# Patient Record
Sex: Female | Born: 1955 | Race: White | Hispanic: No | Marital: Married | Smoking: Former smoker
Health system: Southern US, Community
[De-identification: ages and names within clinical notes are randomized; demographics above are authoritative.]

## PROBLEM LIST (undated history)

## (undated) DIAGNOSIS — I10 Essential (primary) hypertension: Secondary | ICD-10-CM

## (undated) DIAGNOSIS — E042 Nontoxic multinodular goiter: Secondary | ICD-10-CM

## (undated) DIAGNOSIS — Z972 Presence of dental prosthetic device (complete) (partial): Secondary | ICD-10-CM

## (undated) DIAGNOSIS — M1990 Unspecified osteoarthritis, unspecified site: Secondary | ICD-10-CM

## (undated) DIAGNOSIS — J449 Chronic obstructive pulmonary disease, unspecified: Secondary | ICD-10-CM

## (undated) DIAGNOSIS — C439 Malignant melanoma of skin, unspecified: Secondary | ICD-10-CM

## (undated) DIAGNOSIS — J45909 Unspecified asthma, uncomplicated: Secondary | ICD-10-CM

## (undated) DIAGNOSIS — K219 Gastro-esophageal reflux disease without esophagitis: Secondary | ICD-10-CM

## (undated) DIAGNOSIS — E7800 Pure hypercholesterolemia, unspecified: Secondary | ICD-10-CM

## (undated) DIAGNOSIS — R0600 Dyspnea, unspecified: Secondary | ICD-10-CM

## (undated) HISTORY — PX: TUBAL LIGATION: SHX77

## (undated) HISTORY — DX: Gastro-esophageal reflux disease without esophagitis: K21.9

## (undated) HISTORY — PX: BACK SURGERY: SHX140

## (undated) HISTORY — PX: TONSILLECTOMY: SUR1361

## (undated) HISTORY — PX: COLONOSCOPY: SHX174

## (undated) HISTORY — PX: CATARACT EXTRACTION: SUR2

## (undated) HISTORY — PX: CHOLECYSTECTOMY: SHX55

## (undated) HISTORY — DX: Pure hypercholesterolemia, unspecified: E78.00

## (undated) HISTORY — DX: Essential (primary) hypertension: I10

---

## 2003-04-24 ENCOUNTER — Other Ambulatory Visit: Payer: Self-pay

## 2003-06-22 ENCOUNTER — Other Ambulatory Visit: Payer: Self-pay

## 2004-07-27 ENCOUNTER — Ambulatory Visit: Payer: Self-pay | Admitting: Internal Medicine

## 2005-02-15 ENCOUNTER — Ambulatory Visit: Payer: Self-pay | Admitting: Internal Medicine

## 2005-08-16 ENCOUNTER — Emergency Department: Payer: Self-pay | Admitting: General Practice

## 2005-08-16 ENCOUNTER — Other Ambulatory Visit: Payer: Self-pay

## 2005-09-05 ENCOUNTER — Ambulatory Visit: Payer: Self-pay | Admitting: Internal Medicine

## 2005-12-01 ENCOUNTER — Ambulatory Visit: Payer: Self-pay | Admitting: Internal Medicine

## 2006-03-13 ENCOUNTER — Ambulatory Visit: Payer: Self-pay | Admitting: Internal Medicine

## 2006-08-10 ENCOUNTER — Ambulatory Visit: Payer: Self-pay | Admitting: Internal Medicine

## 2007-03-26 ENCOUNTER — Ambulatory Visit: Payer: Self-pay | Admitting: Internal Medicine

## 2008-01-09 ENCOUNTER — Ambulatory Visit: Payer: Self-pay | Admitting: Internal Medicine

## 2008-05-14 ENCOUNTER — Ambulatory Visit: Payer: Self-pay | Admitting: Internal Medicine

## 2009-09-06 ENCOUNTER — Ambulatory Visit: Payer: Self-pay | Admitting: Family Medicine

## 2010-09-22 ENCOUNTER — Ambulatory Visit: Payer: Self-pay | Admitting: Family Medicine

## 2011-04-18 DIAGNOSIS — R569 Unspecified convulsions: Secondary | ICD-10-CM

## 2011-04-18 HISTORY — DX: Unspecified convulsions: R56.9

## 2011-09-27 ENCOUNTER — Ambulatory Visit: Payer: Self-pay | Admitting: Internal Medicine

## 2011-10-11 ENCOUNTER — Ambulatory Visit: Payer: Self-pay | Admitting: Internal Medicine

## 2012-03-04 ENCOUNTER — Other Ambulatory Visit: Payer: Self-pay | Admitting: Internal Medicine

## 2012-03-04 NOTE — Telephone Encounter (Signed)
Refill Trazodone HCI 150 mg 1 tab at bedtime 90 day x 3 months , cyclobenzaprine  HCL tabs 10 mg 1 tab 3 times a day as needed. [facility] Mail order.

## 2012-03-06 MED ORDER — TRAZODONE HCL 150 MG PO TABS: 150.0000 mg | ORAL_TABLET | Freq: Every day | ORAL | Status: DC

## 2012-03-06 MED ORDER — CYCLOBENZAPRINE HCL 10 MG PO TABS: 10.0000 mg | ORAL_TABLET | Freq: Three times a day (TID) | ORAL | Status: DC | PRN

## 2012-03-06 NOTE — Telephone Encounter (Signed)
Sent in to pharmacy. Sent in to pharmacy.  

## 2012-05-08 ENCOUNTER — Telehealth: Payer: Self-pay | Admitting: Internal Medicine

## 2012-05-08 NOTE — Telephone Encounter (Signed)
Patient Information:  Caller Name: Nitasha  Phone: [phone]  Patient: Veronica Kennedy, Veronica Kennedy  Gender: Female  DOB: February 27, 1956  Age: 56 Years  PCP: Dale Alderton (Adults only)  Office Follow Up:  Does the office need to follow up with this patient?: No  Instructions For The Office: N/A  RN Note:  Productive cough with green phlem.  Hydrate and humidify.  Symptoms  Reason For Call & Symptoms: Feels poorly with productive cough, weakness, sore throat, and body aches.  Reviewed Health History In EMR: Yes  Reviewed Medications In EMR: Yes  Reviewed Allergies In EMR: Yes  Reviewed Surgeries / Procedures: Yes  Date of Onset of Symptoms: 05/05/2012  Treatments Tried: Alka-Seltzer Plus Cold  Treatments Tried Worked: Yes  Guideline(s) Used:  Cough  Disposition Per Guideline:   See Within 3 Days in Office  Reason For Disposition Reached:   Taking an ACE Inhibitor medication (e.g., benazepril/LOTENSIN, captopril/CAPOTEN, enalapril/VASOTEC, lisinopril/ZESTRIL)  Advice Given:  Reassurance  Coughing is the way that our lungs remove irritants and mucus. It helps protect our lungs from getting pneumonia.  Caution - Dextromethorphan:   Do not try to completely suppress coughs that produce mucus and phlegm. Remember that coughing is helpful in bringing up mucus from the lungs and preventing pneumonia.  Expected Course:   The expected course depends on what is causing the cough.  Viral bronchitis (chest cold) causes a cough that lasts 1 to 3 weeks. Sometimes you may cough up lots of phlegm (sputum, mucus). The mucus can normally be white, gray, yellow, or green.  Call Back If:  Difficulty breathing  Cough lasts more than 3 weeks  You become worse.  Appointment Scheduled:  05/09/2012 09:00:00 Appointment Scheduled Provider:  Orville Govern

## 2012-05-09 ENCOUNTER — Ambulatory Visit: Payer: Self-pay | Admitting: Adult Health

## 2012-05-13 ENCOUNTER — Ambulatory Visit: Payer: Self-pay

## 2012-05-13 LAB — RAPID STREP-A WITH REFLX: Micro Text Report: NEGATIVE

## 2012-05-15 LAB — BETA STREP CULTURE(ARMC)

## 2012-06-19 ENCOUNTER — Other Ambulatory Visit: Payer: Self-pay | Admitting: Internal Medicine

## 2012-06-19 NOTE — Telephone Encounter (Signed)
Received refill request electronically from pharmacy. Is it okay to refill? 

## 2012-06-20 ENCOUNTER — Telehealth: Payer: Self-pay | Admitting: *Deleted

## 2012-06-20 MED ORDER — TIOTROPIUM BROMIDE MONOHYDRATE 18 MCG IN CAPS: 18.0000 ug | ORAL_CAPSULE | Freq: Every day | RESPIRATORY_TRACT | Status: DC

## 2012-06-20 NOTE — Telephone Encounter (Signed)
Refilled trazodone.

## 2012-06-20 NOTE — Telephone Encounter (Signed)
Sent in to pharmacy.  

## 2012-06-20 NOTE — Telephone Encounter (Signed)
Refill Request  Spiriva Handihaler  Caps 90's   Inhale the contents of 1 capsule once daily

## 2012-08-28 ENCOUNTER — Other Ambulatory Visit: Payer: Self-pay | Admitting: Internal Medicine

## 2012-08-29 ENCOUNTER — Telehealth: Payer: Self-pay | Admitting: *Deleted

## 2012-08-29 MED ORDER — CITALOPRAM HYDROBROMIDE 40 MG PO TABS: 60.0000 mg | ORAL_TABLET | Freq: Every day | ORAL | Status: DC

## 2012-08-29 NOTE — Telephone Encounter (Signed)
Okay to refill? No record of requested medication

## 2012-08-29 NOTE — Telephone Encounter (Signed)
This was a pt of mine at [facility].  i have not seen her here.  She has an appt with me in 6/14.  i know she was on citalopram but I do not remember the dose.  If this is what she has been on - then ok to refill x one month to get her to her appt.   Thanks.

## 2012-08-29 NOTE — Telephone Encounter (Signed)
Refill Request  Citalopram hbr tab 40 mg  Take one and one-half tablets once a day

## 2012-09-02 ENCOUNTER — Telehealth: Payer: Self-pay | Admitting: Internal Medicine

## 2012-09-02 NOTE — Telephone Encounter (Signed)
Patient wanting labs done prior to her physical on 6.5.14. Needing orders in the system.

## 2012-09-03 NOTE — Telephone Encounter (Signed)
Pt informed

## 2012-09-03 NOTE — Telephone Encounter (Signed)
I have not seen her yet here.  Will need to be seen before ordering labs (I don't know that insurance would cover - since not seen here).

## 2012-09-15 ENCOUNTER — Ambulatory Visit: Payer: Self-pay | Admitting: Family Medicine

## 2012-09-15 LAB — CBC WITH DIFFERENTIAL/PLATELET
Basophil #: 0 10*3/uL (ref 0.0–0.1)
Basophil %: 0.3 %
Eosinophil #: 0.2 10*3/uL (ref 0.0–0.7)
HCT: 34.5 % — ABNORMAL LOW (ref 35.0–47.0)
HGB: 12 g/dL (ref 12.0–16.0)
Lymphocyte #: 1 10*3/uL (ref 1.0–3.6)
Lymphocyte %: 7.3 %
MCH: 31.1 pg (ref 26.0–34.0)
MCV: 89 fL (ref 80–100)
Platelet: 393 10*3/uL (ref 150–440)
WBC: 13.7 10*3/uL — ABNORMAL HIGH (ref 3.6–11.0)

## 2012-09-15 LAB — COMPREHENSIVE METABOLIC PANEL
Albumin: 2.6 g/dL — ABNORMAL LOW (ref 3.4–5.0)
Anion Gap: 7 (ref 7–16)
BUN: 14 mg/dL (ref 7–18)
Bilirubin,Total: 0.6 mg/dL (ref 0.2–1.0)
Calcium, Total: 9.4 mg/dL (ref 8.5–10.1)
Chloride: 96 mmol/L — ABNORMAL LOW (ref 98–107)
Co2: 32 mmol/L (ref 21–32)
Creatinine: 0.69 mg/dL (ref 0.60–1.30)
EGFR (African American): >60
Glucose: 85 mg/dL (ref 65–99)
SGOT(AST): 22 U/L (ref 15–37)
SGPT (ALT): 35 U/L (ref 12–78)
Sodium: 135 mmol/L — ABNORMAL LOW (ref 136–145)
Total Protein: 7.6 g/dL (ref 6.4–8.2)

## 2012-09-15 LAB — RAPID INFLUENZA A&B ANTIGENS

## 2012-09-17 ENCOUNTER — Other Ambulatory Visit: Payer: Self-pay

## 2012-09-19 ENCOUNTER — Ambulatory Visit (INDEPENDENT_AMBULATORY_CARE_PROVIDER_SITE_OTHER): Payer: BC Managed Care – PPO | Admitting: Internal Medicine

## 2012-09-19 ENCOUNTER — Encounter: Payer: Self-pay | Admitting: *Deleted

## 2012-09-19 VITALS — BP 106/70 | HR 83 | Temp 98.3°F | Ht 64.0 in | Wt 172.5 lb

## 2012-09-19 DIAGNOSIS — I10 Essential (primary) hypertension: Secondary | ICD-10-CM

## 2012-09-19 DIAGNOSIS — R5381 Other malaise: Secondary | ICD-10-CM

## 2012-09-19 DIAGNOSIS — K219 Gastro-esophageal reflux disease without esophagitis: Secondary | ICD-10-CM

## 2012-09-19 DIAGNOSIS — Z733 Stress, not elsewhere classified: Secondary | ICD-10-CM

## 2012-09-19 DIAGNOSIS — E7800 Pure hypercholesterolemia, unspecified: Secondary | ICD-10-CM

## 2012-09-19 DIAGNOSIS — R5383 Other fatigue: Secondary | ICD-10-CM

## 2012-09-19 DIAGNOSIS — Z1239 Encounter for other screening for malignant neoplasm of breast: Secondary | ICD-10-CM

## 2012-09-19 DIAGNOSIS — F439 Reaction to severe stress, unspecified: Secondary | ICD-10-CM

## 2012-09-20 ENCOUNTER — Other Ambulatory Visit (INDEPENDENT_AMBULATORY_CARE_PROVIDER_SITE_OTHER): Payer: BC Managed Care – PPO

## 2012-09-20 DIAGNOSIS — E7800 Pure hypercholesterolemia, unspecified: Secondary | ICD-10-CM

## 2012-09-20 DIAGNOSIS — R5383 Other fatigue: Secondary | ICD-10-CM

## 2012-09-20 DIAGNOSIS — R5381 Other malaise: Secondary | ICD-10-CM

## 2012-09-20 DIAGNOSIS — I10 Essential (primary) hypertension: Secondary | ICD-10-CM

## 2012-09-20 LAB — CBC WITH DIFFERENTIAL/PLATELET
Eosinophils Relative: 1.8 % (ref 0.0–5.0)
HCT: 35.9 % — ABNORMAL LOW (ref 36.0–46.0)
Hemoglobin: 12.2 g/dL (ref 12.0–15.0)
Lymphs Abs: 1.3 10*3/uL (ref 0.7–4.0)
MCV: 91.7 fl (ref 78.0–100.0)
Monocytes Absolute: 0.5 10*3/uL (ref 0.1–1.0)
Monocytes Relative: 7.6 % (ref 3.0–12.0)
Neutro Abs: 4.1 10*3/uL (ref 1.4–7.7)
Platelets: 525 10*3/uL — ABNORMAL HIGH (ref 150.0–400.0)
WBC: 6 10*3/uL (ref 4.5–10.5)

## 2012-09-20 LAB — TSH: TSH: 0.29 u[IU]/mL — ABNORMAL LOW (ref 0.35–5.50)

## 2012-09-20 LAB — HEPATIC FUNCTION PANEL
ALT: 24 U/L (ref 0–35)
AST: 17 U/L (ref 0–37)
Albumin: 2.8 g/dL — ABNORMAL LOW (ref 3.5–5.2)
Total Bilirubin: 0.3 mg/dL (ref 0.3–1.2)
Total Protein: 6.3 g/dL (ref 6.0–8.3)

## 2012-09-20 LAB — BASIC METABOLIC PANEL
BUN: 23 mg/dL (ref 6–23)
Chloride: 103 mEq/L (ref 96–112)
Glucose, Bld: 89 mg/dL (ref 70–99)
Potassium: 4.7 mEq/L (ref 3.5–5.1)
Sodium: 141 mEq/L (ref 135–145)

## 2012-09-20 LAB — LIPID PANEL
Cholesterol: 124 mg/dL (ref 0–200)
HDL: 31.7 mg/dL — ABNORMAL LOW (ref >39.00)
Triglycerides: 56 mg/dL (ref 0.0–149.0)

## 2012-09-21 ENCOUNTER — Other Ambulatory Visit: Payer: Self-pay | Admitting: Internal Medicine

## 2012-09-21 ENCOUNTER — Encounter: Payer: Self-pay | Admitting: Internal Medicine

## 2012-09-21 DIAGNOSIS — F439 Reaction to severe stress, unspecified: Secondary | ICD-10-CM | POA: Insufficient documentation

## 2012-09-21 DIAGNOSIS — K219 Gastro-esophageal reflux disease without esophagitis: Secondary | ICD-10-CM | POA: Insufficient documentation

## 2012-09-21 MED ORDER — CITALOPRAM HYDROBROMIDE 40 MG PO TABS: 60.0000 mg | ORAL_TABLET | Freq: Every day | ORAL | Status: AC

## 2012-09-21 NOTE — Progress Notes (Signed)
Refilled citalopram #135 with one refill.

## 2012-09-21 NOTE — Progress Notes (Signed)
Subjective:    Patient ID: Veronica Kennedy, female    DOB: 11-Feb-1956, 57 y.o.   MRN: [id number]  HPI 57 year old female with past history of hypertension, hypercholesterolemia and GERD who comes in today to follow up on these issues as well as for a complete physical exam.  She was seen recently in [facility].  Was diagnosed with RLL pneumonia.  Was dehydrated.  Given IVFs and started on Levaquin.  She continues on abx now.  Symptoms are improving.  Using albuterol prn.  Also using her spiriva.  No increased sob.  No chest pain.  No acid reflux.  Takes nexium.  Some nausea.  No vomiting.  Bowels stable.   Past Medical History  Diagnosis Date  . Hypertension   . Hypercholesterolemia   . GERD (gastroesophageal reflux disease)     Outpatient Encounter Prescriptions as of 09/19/2012  Medication Sig Dispense Refill  . albuterol (PROVENTIL HFA;VENTOLIN HFA) 108 (90 BASE) MCG/ACT inhaler Inhale 2 puffs into the lungs every 6 (six) hours as needed for wheezing.      Marland Kitchen atorvastatin (LIPITOR) 10 MG tablet Take 10 mg by mouth daily.      . citalopram (CELEXA) 40 MG tablet Take 1.5 tablets (60 mg total) by mouth daily.  45 tablet  0  . cyclobenzaprine (FLEXERIL) 10 MG tablet Take 1 tablet (10 mg total) by mouth 3 (three) times daily as needed.  90 tablet  3  . esomeprazole (NEXIUM) 40 MG capsule Take 40 mg by mouth daily.      . hydrochlorothiazide (HYDRODIURIL) 25 MG tablet Take 25 mg by mouth daily.      . ramipril (ALTACE) 2.5 MG capsule Take 2.5 mg by mouth daily.      Marland Kitchen SPIRIVA HANDIHALER 18 MCG inhalation capsule INHALE THE CONTENTS OF 1 CAPSULE DAILY  90 capsule  0  . traZODone (DESYREL) 150 MG tablet TAKE 1 TABLET (150 MG TOTAL) AT BEDTIME  30 tablet  2   No facility-administered encounter medications on file as of 09/19/2012.    Review of Systems Patient denies any headache, lightheadedness or dizziness.  No significant sinus symptoms currently.  No chest pain, tightness or  palpitations.  No increased shortness of breath.  Being treated for pneumonia.  Some nausea.  No vomiting.  No abdominal pain or cramping.  No bowel change, such as diarrhea, constipation, BRBPR or melana.  No urine change.   On citalopram.  Feels she is handling things relatively well. Taking Levaquin now.  Symptoms improving.  She does report some increased fatigue.      Objective:   Physical Exam Filed Vitals:   09/19/12 1556  BP: 106/70  Pulse: 83  Temp: 98.3 F (27.4 C)   57 year old female in no acute distress.   HEENT:  Nares- clear.  Oropharynx - without lesions. NECK:  Supple.  Nontender.  No audible bruit.  HEART:  Appears to be regular. LUNGS:  No crackles or wheezing audible.  Respirations even and unlabored.  RADIAL PULSE:  Equal bilaterally.    BREASTS:  No nipple discharge or nipple retraction present.  Could not appreciate any distinct nodules or axillary adenopathy.  ABDOMEN:  Soft, nontender.  Bowel sounds present and normal.  No audible abdominal bruit.  EXTREMITIES:  No increased edema present.  DP pulses palpable and equal bilaterally.          Assessment & Plan:  PNEUMONIA.  Evaluated at [facility].  On Levaquin.  Using the inhalers.  Has improved.  Discussed with her regarding the need for a follow up CXR in a few weeks.    FATIGUE.  Check cbc, met c and tsh.   HEALTH MAINTENANCE.  Physical today.  Schedule mammogram.  Need to obtain outside records for review.

## 2012-09-21 NOTE — Assessment & Plan Note (Signed)
Symptoms controlled.  Follow.  Continue nexium.

## 2012-09-21 NOTE — Assessment & Plan Note (Signed)
Blood pressure doing well.  Continue same medication regimen.  Follow.  Check metabolic panel.

## 2012-09-21 NOTE — Assessment & Plan Note (Signed)
Continues on Atorvastatin.  Check lipid panel and liver function.

## 2012-09-21 NOTE — Assessment & Plan Note (Signed)
On citalopram.  Refill.  Follow.  Appears to be stable.

## 2012-09-23 ENCOUNTER — Ambulatory Visit: Payer: BC Managed Care – PPO

## 2012-09-23 ENCOUNTER — Other Ambulatory Visit: Payer: Self-pay | Admitting: Internal Medicine

## 2012-09-23 DIAGNOSIS — R7989 Other specified abnormal findings of blood chemistry: Secondary | ICD-10-CM

## 2012-09-23 DIAGNOSIS — D473 Essential (hemorrhagic) thrombocythemia: Secondary | ICD-10-CM

## 2012-09-23 DIAGNOSIS — E039 Hypothyroidism, unspecified: Secondary | ICD-10-CM

## 2012-09-23 NOTE — Progress Notes (Signed)
Follow up labs ordered.

## 2012-09-30 ENCOUNTER — Other Ambulatory Visit: Payer: Self-pay | Admitting: *Deleted

## 2012-09-30 MED ORDER — HYDROCHLOROTHIAZIDE 25 MG PO TABS: 25.0000 mg | ORAL_TABLET | Freq: Every day | ORAL | Status: DC

## 2012-09-30 MED ORDER — TRAZODONE HCL 150 MG PO TABS: ORAL_TABLET | ORAL | Status: DC

## 2012-10-15 ENCOUNTER — Ambulatory Visit: Payer: Self-pay | Admitting: Internal Medicine

## 2012-10-22 ENCOUNTER — Telehealth: Payer: Self-pay | Admitting: *Deleted

## 2012-10-22 ENCOUNTER — Other Ambulatory Visit (INDEPENDENT_AMBULATORY_CARE_PROVIDER_SITE_OTHER): Payer: BC Managed Care – PPO

## 2012-10-22 DIAGNOSIS — D75839 Thrombocytosis, unspecified: Secondary | ICD-10-CM

## 2012-10-22 DIAGNOSIS — D473 Essential (hemorrhagic) thrombocythemia: Secondary | ICD-10-CM

## 2012-10-22 DIAGNOSIS — R946 Abnormal results of thyroid function studies: Secondary | ICD-10-CM

## 2012-10-22 DIAGNOSIS — R7989 Other specified abnormal findings of blood chemistry: Secondary | ICD-10-CM

## 2012-10-22 NOTE — Telephone Encounter (Signed)
Pt was in having labs drawn & wanted to ask if she still needs a f/u Chest x-ray for recent Pneumonia. Pt states that she is no longer symptomatic. If she needs to have it done, she would like it ordered through [facility] [place] location

## 2012-10-23 LAB — CBC WITH DIFFERENTIAL/PLATELET
Basophils Relative: 0.6 % (ref 0.0–3.0)
Eosinophils Relative: 1.8 % (ref 0.0–5.0)
HCT: 35 % — ABNORMAL LOW (ref 36.0–46.0)
Lymphs Abs: 1.9 10*3/uL (ref 0.7–4.0)
MCV: 94.4 fl (ref 78.0–100.0)
Monocytes Absolute: 0.4 10*3/uL (ref 0.1–1.0)
Monocytes Relative: 7.3 % (ref 3.0–12.0)
Neutrophils Relative %: 54.9 % (ref 43.0–77.0)
Platelets: 270 10*3/uL (ref 150.0–400.0)
RBC: 3.71 Mil/uL — ABNORMAL LOW (ref 3.87–5.11)
WBC: 5.3 10*3/uL (ref 4.5–10.5)

## 2012-10-23 LAB — TSH: TSH: 0.47 u[IU]/mL (ref 0.35–5.50)

## 2012-10-23 NOTE — Telephone Encounter (Signed)
Xray form completed,signed and in your box.

## 2012-10-23 NOTE — Telephone Encounter (Signed)
Left message on pt v/m to let her know that the order was faxed to Radiology-No appt necessary.

## 2012-10-24 ENCOUNTER — Ambulatory Visit: Payer: Self-pay | Admitting: Internal Medicine

## 2012-10-24 ENCOUNTER — Telehealth: Payer: Self-pay | Admitting: Internal Medicine

## 2012-10-24 ENCOUNTER — Ambulatory Visit: Payer: BC Managed Care – PPO

## 2012-10-24 DIAGNOSIS — D649 Anemia, unspecified: Secondary | ICD-10-CM

## 2012-10-24 LAB — FERRITIN: Ferritin: 54.4 ng/mL (ref 10.0–291.0)

## 2012-10-24 LAB — IBC PANEL
Iron: 83 ug/dL (ref 42–145)
Saturation Ratios: 23 % (ref 20.0–50.0)

## 2012-10-24 NOTE — Telephone Encounter (Signed)
Order placed for add on lab (iron studies)

## 2012-10-25 ENCOUNTER — Encounter: Payer: Self-pay | Admitting: Internal Medicine

## 2012-10-25 ENCOUNTER — Telehealth: Payer: Self-pay | Admitting: Internal Medicine

## 2012-10-25 ENCOUNTER — Encounter: Payer: Self-pay | Admitting: *Deleted

## 2012-10-25 DIAGNOSIS — D649 Anemia, unspecified: Secondary | ICD-10-CM

## 2012-10-25 NOTE — Telephone Encounter (Signed)
Pt notified of lab results via my chart.  She needs a f/u non fasting lab in 4-6 weeks.  Please schedule her for a lab appt and call her with appt date and time.  Thanks.

## 2012-10-28 ENCOUNTER — Encounter: Payer: Self-pay | Admitting: *Deleted

## 2012-10-28 ENCOUNTER — Ambulatory Visit: Payer: Self-pay | Admitting: Internal Medicine

## 2012-10-28 NOTE — Telephone Encounter (Signed)
Sent my chart message letting pt know her appointment is 8/18

## 2012-11-01 ENCOUNTER — Encounter: Payer: Self-pay | Admitting: Internal Medicine

## 2012-11-07 ENCOUNTER — Encounter: Payer: Self-pay | Admitting: Internal Medicine

## 2012-11-08 ENCOUNTER — Other Ambulatory Visit: Payer: Self-pay | Admitting: *Deleted

## 2012-11-08 MED ORDER — ATORVASTATIN CALCIUM 10 MG PO TABS: 10.0000 mg | ORAL_TABLET | Freq: Every day | ORAL | Status: AC

## 2012-11-08 MED ORDER — RAMIPRIL 2.5 MG PO CAPS: 2.5000 mg | ORAL_CAPSULE | Freq: Every day | ORAL | Status: DC

## 2012-11-21 ENCOUNTER — Encounter: Payer: Self-pay | Admitting: Internal Medicine

## 2012-12-02 ENCOUNTER — Other Ambulatory Visit: Payer: Self-pay | Admitting: *Deleted

## 2012-12-02 ENCOUNTER — Other Ambulatory Visit: Payer: BC Managed Care – PPO

## 2012-12-03 MED ORDER — ESOMEPRAZOLE MAGNESIUM 40 MG PO CPDR: 40.0000 mg | DELAYED_RELEASE_CAPSULE | Freq: Every day | ORAL | Status: DC

## 2012-12-21 ENCOUNTER — Other Ambulatory Visit: Payer: Self-pay | Admitting: Internal Medicine

## 2013-01-02 ENCOUNTER — Ambulatory Visit: Payer: Self-pay

## 2013-01-07 ENCOUNTER — Other Ambulatory Visit: Payer: Self-pay | Admitting: *Deleted

## 2013-01-08 ENCOUNTER — Encounter: Payer: Self-pay | Admitting: *Deleted

## 2013-01-08 MED ORDER — TRAZODONE HCL 150 MG PO TABS: ORAL_TABLET | ORAL | Status: AC

## 2013-01-08 NOTE — Telephone Encounter (Signed)
Sent pt a mychart to inform her that she needs to pick up RX & schedule a follow-up appt.

## 2013-01-08 NOTE — Telephone Encounter (Signed)
I refilled her medication x 1.  She needs a f/u appt with me within the next month ( ) appt.

## 2013-01-13 ENCOUNTER — Encounter: Payer: Self-pay | Admitting: *Deleted

## 2013-01-21 ENCOUNTER — Ambulatory Visit: Payer: BC Managed Care – PPO | Admitting: Internal Medicine

## 2013-01-29 ENCOUNTER — Encounter: Payer: Self-pay | Admitting: Internal Medicine

## 2013-02-20 ENCOUNTER — Other Ambulatory Visit: Payer: Self-pay

## 2013-02-21 ENCOUNTER — Ambulatory Visit: Payer: BC Managed Care – PPO | Admitting: Internal Medicine

## 2013-06-30 ENCOUNTER — Other Ambulatory Visit: Payer: Self-pay | Admitting: Internal Medicine

## 2013-06-30 NOTE — Telephone Encounter (Signed)
Last visit 6/14, has cancelled follow up appts that were scheduled for refills, refill Nexium?

## 2013-06-30 NOTE — Telephone Encounter (Signed)
I have not seen her since 09/2012.  Is she still coming here or is she seeing someone else?  She has missed appts.  If still coming here, then needs a f/u appt with me and can refill until appt.  appt needs to be 32min or end of 1/2 day.   Dr Nicki Reaper

## 2013-09-05 ENCOUNTER — Ambulatory Visit: Payer: Self-pay | Admitting: Family Medicine

## 2013-09-19 ENCOUNTER — Ambulatory Visit: Payer: Self-pay | Admitting: Gastroenterology

## 2013-09-22 ENCOUNTER — Encounter: Payer: BC Managed Care – PPO | Admitting: Internal Medicine

## 2013-09-22 LAB — PATHOLOGY REPORT

## 2013-11-10 ENCOUNTER — Ambulatory Visit: Payer: Self-pay | Admitting: Family Medicine

## 2014-03-20 ENCOUNTER — Ambulatory Visit: Payer: Self-pay | Admitting: Internal Medicine

## 2014-11-26 ENCOUNTER — Other Ambulatory Visit: Payer: Self-pay | Admitting: Family Medicine

## 2014-11-26 DIAGNOSIS — Z1231 Encounter for screening mammogram for malignant neoplasm of breast: Secondary | ICD-10-CM

## 2014-12-01 ENCOUNTER — Ambulatory Visit
Admission: RE | Admit: 2014-12-01 | Discharge: 2014-12-01 | Disposition: A | Discharge status: Home / Self Care | Payer: BLUE CROSS/BLUE SHIELD | Source: Ambulatory Visit | Attending: Family Medicine | Admitting: Family Medicine

## 2014-12-01 DIAGNOSIS — Z1231 Encounter for screening mammogram for malignant neoplasm of breast: Secondary | ICD-10-CM | POA: Insufficient documentation

## 2015-05-04 ENCOUNTER — Ambulatory Visit (INDEPENDENT_AMBULATORY_CARE_PROVIDER_SITE_OTHER): Payer: BLUE CROSS/BLUE SHIELD

## 2015-05-04 ENCOUNTER — Encounter: Payer: Self-pay | Admitting: Emergency Medicine

## 2015-05-04 ENCOUNTER — Ambulatory Visit
Admission: EM | Admit: 2015-05-04 | Discharge: 2015-05-04 | Disposition: A | Discharge status: Home / Self Care | Payer: BLUE CROSS/BLUE SHIELD | Attending: Emergency Medicine | Admitting: Emergency Medicine

## 2015-05-04 DIAGNOSIS — J441 Chronic obstructive pulmonary disease with (acute) exacerbation: Secondary | ICD-10-CM

## 2015-05-04 HISTORY — DX: Chronic obstructive pulmonary disease, unspecified: J44.9

## 2015-05-04 LAB — BASIC METABOLIC PANEL
ANION GAP: 7 (ref 5–15)
BUN: 12 mg/dL (ref 6–20)
CALCIUM: 9.6 mg/dL (ref 8.9–10.3)
CO2: 32 mmol/L (ref 22–32)
CREATININE: 0.73 mg/dL (ref 0.44–1.00)
Chloride: 100 mmol/L — ABNORMAL LOW (ref 101–111)
GFR calc Af Amer: >60 mL/min (ref >60)
GLUCOSE: 91 mg/dL (ref 65–99)
Potassium: 4.4 mmol/L (ref 3.5–5.1)
Sodium: 139 mmol/L (ref 135–145)

## 2015-05-04 LAB — CBC WITH DIFFERENTIAL/PLATELET
BASOS ABS: 0 10*3/uL (ref 0–0.1)
BASOS PCT: 1 %
EOS PCT: 3 %
Eosinophils Absolute: 0.1 10*3/uL (ref 0–0.7)
HEMATOCRIT: 39.5 % (ref 35.0–47.0)
Hemoglobin: 13.4 g/dL (ref 12.0–16.0)
LYMPHS PCT: 29 %
Lymphs Abs: 1.4 10*3/uL (ref 1.0–3.6)
MCH: 31.1 pg (ref 26.0–34.0)
MCHC: 33.9 g/dL (ref 32.0–36.0)
MCV: 91.6 fL (ref 80.0–100.0)
MONO ABS: 0.4 10*3/uL (ref 0.2–0.9)
MONOS PCT: 8 %
NEUTROS ABS: 2.8 10*3/uL (ref 1.4–6.5)
Neutrophils Relative %: 59 %
PLATELETS: 258 10*3/uL (ref 150–440)
RBC: 4.31 MIL/uL (ref 3.80–5.20)
RDW: 12.8 % (ref 11.5–14.5)
WBC: 4.7 10*3/uL (ref 3.6–11.0)

## 2015-05-04 LAB — URINALYSIS COMPLETE WITH MICROSCOPIC (ARMC ONLY)
BACTERIA UA: NONE SEEN
BILIRUBIN URINE: NEGATIVE
GLUCOSE, UA: NEGATIVE mg/dL
Hgb urine dipstick: NEGATIVE
KETONES UR: NEGATIVE mg/dL
Leukocytes, UA: NEGATIVE
Nitrite: NEGATIVE
Protein, ur: NEGATIVE mg/dL
Specific Gravity, Urine: 1.015 (ref 1.005–1.030)
WBC, UA: NONE SEEN WBC/hpf (ref 0–5)
pH: 7.5 (ref 5.0–8.0)

## 2015-05-04 MED ORDER — PREDNISONE 10 MG PO TABS: ORAL_TABLET | ORAL | Status: DC

## 2015-05-04 MED ORDER — IPRATROPIUM-ALBUTEROL 0.5-2.5 (3) MG/3ML IN SOLN
3.0000 mL | Freq: Four times a day (QID) | RESPIRATORY_TRACT | Status: DC
  Administered 2015-05-04: 3 mL via RESPIRATORY_TRACT

## 2015-05-04 NOTE — ED Notes (Signed)
Patient c/o SOB and wheezing for the past week.  Patient states that she has COPD.  Patient denies any cold symptoms.

## 2015-05-04 NOTE — ED Notes (Signed)
Patient transported to X-ray 

## 2015-05-04 NOTE — ED Notes (Signed)
Nebulizer Tx complete.. Pt states she is feeling better.Marland Kitchen

## 2015-05-04 NOTE — Discharge Instructions (Signed)
Take medication as prescribed. Rest. Drink plenty of fluids. Avoid triggers. Continue home inhalers.  Follow-up closely with her primary care physician or pulmonologist this week. Return to urgent care proceed to ER for chest pain, shortness of breath, dizziness, weakness, fevers, new or worsening concerns.  Chronic Obstructive Pulmonary Disease Exacerbation Chronic obstructive pulmonary disease (COPD) is a common lung problem. In COPD, the flow of air from the lungs is limited. COPD exacerbations are times that breathing gets worse and you need extra treatment. Without treatment they can be life threatening. If they happen often, your lungs can become more damaged. If your COPD gets worse, your doctor may treat you with:  Medicines.  Oxygen.  Different ways to clear your airway, such as using a mask. HOME CARE  Do not smoke.  Avoid tobacco smoke and other things that bother your lungs.  If given, take your antibiotic medicine as told. Finish the medicine even if you start to feel better.  Only take medicines as told by your doctor.  Drink enough fluids to keep your pee (urine) clear or pale yellow (unless your doctor has told you not to).  Use a cool mist machine (vaporizer).  If you use oxygen or a machine that turns liquid medicine into a mist (nebulizer), continue to use them as told.  Keep up with shots (vaccinations) as told by your doctor.  Exercise regularly.  Eat healthy foods.  Keep all doctor visits as told. GET HELP RIGHT AWAY IF:  You are very short of breath and it gets worse.  You have trouble talking.  You have bad chest pain.  You have blood in your spit (sputum).  You have a fever.  You keep throwing up (vomiting).  You feel weak, or you pass out (faint).  You feel confused.  You keep getting worse. MAKE SURE YOU:  Understand these instructions.  Will watch your condition.  Will get help right away if you are not doing well or get worse.   This information is not intended to replace advice given to you by your health care provider. Make sure you discuss any questions you have with your health care provider.   Document Released: 03/23/2011 Document Revised: 04/24/2014 Document Reviewed: 12/06/2012 Elsevier Interactive Patient Education [employer].

## 2015-05-04 NOTE — ED Provider Notes (Signed)
[place] [facility]  ____________________________________________  Time seen: Approximately 9:04 AM  I have reviewed the triage vital signs and the nursing notes.   HISTORY  Chief Complaint Shortness of Breath   HPI Veronica Kennedy is a 60 y.o. female   presents today for the complaint of wheezing and shortness of breath. Patient reports that she has a chronic history of COPD with intermittent exacerbations. Patient states over last week she has had increased wheezing. Patient states that she believes there is some correlation between a different air barrier that blows air  at work as well as with weather changes. Patient states that she does occasionally have some shortness of breath, but reports that this is only noticed when her wheezing has increased or she is doing more strenuous activity. Denies any shortness of breath while at rest or in casual activities. Denies any chest pain or chest pain with deep breaths.  Patient reports that she has had a little bit of runny nose and occasional cough. Denies nasal congestion, fevers, sore throat, productive cough. Denies current shortness of breath. Denies chest pain, abdominal pain, dizziness, weakness, neck or back pain, extremity swelling or rash. Reports continues to eat and drink well. Reports has been using home albuterol inhalers with some improvement but no resolution. Denies any recent COPD exacerbation.  PCP: [facility]: [facility]   Past Medical History  Diagnosis Date  . Hypertension   . Hypercholesterolemia   . GERD (gastroesophageal reflux disease)   . COPD (chronic obstructive pulmonary disease) [facility])     Patient Active Problem List   Diagnosis Date Noted  . GERD (gastroesophageal reflux disease) 09/21/2012  . Stress 09/21/2012  . Essential hypertension, benign 09/19/2012  . Hypercholesterolemia 09/19/2012    Past Surgical History  Procedure Laterality Date  . Tubal ligation    . Tonsillectomy    .  Cholecystectomy      Current Outpatient Rx  Name  Route  Sig  Dispense  Refill  . albuterol (PROVENTIL HFA;VENTOLIN HFA) 108 (90 BASE) MCG/ACT inhaler   Inhalation   Inhale 2 puffs into the lungs every 6 (six) hours as needed for wheezing.         Marland Kitchen atorvastatin (LIPITOR) 10 MG tablet   Oral   Take 1 tablet (10 mg total) by mouth daily.   90 tablet   1   . citalopram (CELEXA) 40 MG tablet   Oral   Take 1.5 tablets (60 mg total) by mouth daily.   135 tablet   1       . cyclobenzaprine (FLEXERIL) 10 MG tablet   Oral   Take 1 tablet (10 mg total) by mouth 3 (three) times daily as needed.   90 tablet   3   . esomeprazole (NEXIUM) 40 MG capsule   Oral   Take 1 capsule (40 mg total) by mouth daily.   90 capsule   1   . hydrochlorothiazide (HYDRODIURIL) 25 MG tablet   Oral   Take 1 tablet (25 mg total) by mouth daily.   90 tablet   1   . ramipril (ALTACE) 2.5 MG capsule   Oral   Take 1 capsule (2.5 mg total) by mouth daily.   90 capsule   1   . SPIRIVA HANDIHALER 18 MCG inhalation capsule      INHALE THE CONTENTS OF 1 CAPSULE DAILY   1 capsule   5   . traZODone (DESYREL) 150 MG tablet  TAKE 1 TABLET (150 MG TOTAL) AT BEDTIME   90 tablet   0     Allergies Review of patient's allergies indicates no known allergies.  Family History  Problem Relation Age of Onset  . Breast cancer Maternal Aunt 80  Brother: MI Mom: htn  Social History Social History  Substance Use Topics  . Smoking status: Former Research scientist (life sciences)  . Smokeless tobacco: Never Used  . Alcohol Use: No    Review of Systems Constitutional: No fever/chills Eyes: No visual changes. ENT: No sore throat. Cardiovascular: Denies chest pain. Respiratory: positive intermittent cough, wheezing and shortness of breath.  Gastrointestinal: No abdominal pain.  No nausea, no vomiting.  No diarrhea.  No constipation. Genitourinary: Negative for dysuria. Musculoskeletal: Negative for back pain. Skin:  Negative for rash. Neurological: Negative for headaches, focal weakness or numbness.  10-point ROS otherwise negative.  ____________________________________________   PHYSICAL EXAM:  VITAL SIGNS: ED Triage Vitals  Enc Vitals Group     BP 05/04/15 0816 140/91 mmHg     Pulse Rate 05/04/15 0816 70     Resp 05/04/15 0816 16     Temp 05/04/15 0816 97 F (36.1 C)     Temp Source 05/04/15 0816 Tympanic     SpO2 05/04/15 0816 98 %     Weight 05/04/15 0816 175 lb (79.379 kg)     Height 05/04/15 0816 5' 5.5" (1.664 m)     Head Cir --      Peak Flow --      Pain Score --      Pain Loc --      Pain Edu? --      Excl. in [place]? --     Constitutional: Alert and oriented. Well appearing and in no acute distress. Eyes: Conjunctivae are normal. PERRL. EOMI. Head: Atraumatic. Nontender to palpation. No swelling. No erythema.  Ears: no erythema, normal TMs bilaterally.   Nose: No congestion/rhinnorhea.  Mouth/Throat: Mucous membranes are moist.  Oropharynx non-erythematous. Neck: No stridor.  No cervical spine tenderness to palpation. Hematological/Lymphatic/Immunilogical: No cervical lymphadenopathy. Cardiovascular: Normal rate, regular rhythm. Grossly normal heart sounds.  Good peripheral circulation. Respiratory: Normal respiratory effort.  No retractions. No rhonchi or rales. Mild to moderate inspiratory and expiratory wheezes noted throughout. Speaks in complete sentences. Ambulatory in room while speaking complete sentences. No respiratory distress. No nasal flaring. Sitting up on the side of bed comfortably. No tripod positioning. Gastrointestinal: Soft and nontender. No distention. Normal Bowel sounds. No CVA tenderness. Musculoskeletal: No lower or upper extremity tenderness nor edema.  Bilateral pedal pulses equal and easily palpated. Bilateral calf nontender.  Neurologic:  Normal speech and language. No gross focal neurologic deficits are appreciated. No gait instability. Skin:  Skin  is warm, dry and intact. No rash noted. Psychiatric: Mood and affect are normal. Speech and behavior are normal.  ____________________________________________   LABS (all labs ordered are listed, but only abnormal results are displayed)  Labs Reviewed  BASIC METABOLIC PANEL - Abnormal; Notable for the following:    Chloride 100 (*)    All other components within normal limits  URINALYSIS COMPLETEWITH MICROSCOPIC ([facility] ONLY) - Abnormal; Notable for the following:    Squamous Epithelial / LPF 0-5 (*)    All other components within normal limits  CBC WITH DIFFERENTIAL/PLATELET   RADIOLOGY  EXAM: CHEST 2 VIEW  COMPARISON: 01/02/2013 chest radiograph.  FINDINGS: Stable cardiomediastinal silhouette with normal heart size. No pneumothorax. No pleural effusion. Hyperinflated lungs. No pulmonary edema. No consolidative airspace  disease.  IMPRESSION: 1. No consolidative airspace disease. 2. Hyperinflated lungs, suggesting COPD.   Electronically Signed By: Ilona Sorrel M.D. On: 05/04/2015 09:04 I, Marylene Land, personally viewed and evaluated these images (plain radiographs) as part of my medical decision making, as well as reviewing the written report by the radiologist.  ____________________________________________  INITIAL IMPRESSION / [place] / ED COURSE  Pertinent labs & imaging results that were available during my care of the patient were reviewed by me and considered in my medical decision making (see chart for details).  Very well-appearing patient. No acute distress. Presents for the complaints of intermittent wheezing and shortness of breath over the last week. Patient reports that the symptoms have not acutely increased but have came on gradually and has remained in the last week. Very well-appearing patient. Scattered wheezes. Patient also reports feeling somewhat run down, Patient requests her potassium to be evaluated as history of low potassium  wants to make sure that that is okay. Albuterol ipratropium neb 1. CBC, BMP, urinalysis and chest x-ray. Suspect COPD exacerbation.    Chest x-ray negative for consolidation, hyperinflated lungs suggesting COPD per radiologist. Labs reviewed and overall unremarkable. Will treat patient with oral prednisone taper. Avoid triggers and PCP follow up. As no productive cough/sputum, no fevers, no signs of acute bacterial infection, will not initiate antibiotics at this time.   Discussed follow up with Primary care physician this week. Discussed follow up and return parameters including no resolution or any worsening concerns. Patient verbalized understanding and agreed to plan.   Note: This dictation was prepared with Dragon dictation along with smaller phrase technology. Any transcriptional errors that result from this process are unintentional.   ____________________________________________   FINAL CLINICAL IMPRESSION(S) / ED DIAGNOSES  Final diagnoses:  COPD exacerbation ([place])       Marylene Land, NP 05/04/15 1003  Marylene Land, NP 05/04/15 1004

## 2015-05-10 ENCOUNTER — Ambulatory Visit
Admission: EM | Admit: 2015-05-10 | Discharge: 2015-05-10 | Disposition: A | Discharge status: Home / Self Care | Payer: BLUE CROSS/BLUE SHIELD | Attending: Family Medicine | Admitting: Family Medicine

## 2015-05-10 ENCOUNTER — Encounter: Payer: Self-pay | Admitting: *Deleted

## 2015-05-10 DIAGNOSIS — J441 Chronic obstructive pulmonary disease with (acute) exacerbation: Secondary | ICD-10-CM

## 2015-05-10 MED ORDER — DOXYCYCLINE HYCLATE 100 MG PO TABS: 100.0000 mg | ORAL_TABLET | Freq: Two times a day (BID) | ORAL | Status: DC

## 2015-05-10 MED ORDER — IPRATROPIUM-ALBUTEROL 0.5-2.5 (3) MG/3ML IN SOLN
3.0000 mL | Freq: Once | RESPIRATORY_TRACT | Status: AC
  Administered 2015-05-10: 3 mL via RESPIRATORY_TRACT

## 2015-05-10 MED ORDER — PREDNISONE 20 MG PO TABS: ORAL_TABLET | ORAL | Status: DC

## 2015-05-10 NOTE — ED Notes (Signed)
Patient has SOB upon exertion. She has a history of COPD and has been treated last week at [facility] urgent care.

## 2015-05-10 NOTE — ED Provider Notes (Signed)
CSN: [id number]     Arrival date & time 05/10/15  1720 History   First MD Initiated Contact with Patient 05/10/15 1823     Chief Complaint  Patient presents with  . Shortness of Breath   (Consider location/radiation/quality/duration/timing/severity/associated sxs/prior Treatment) HPI Comments: 60 yo female seen here last week with COPD exacerbation, presents with continuing symptoms. States had mild improvement since last week but still having wheezing and shortness of breath; took/finished one week of prednisone. Denies any fevers, chills, chest pains.   The history is provided by the patient.    Past Medical History  Diagnosis Date  . Hypertension   . Hypercholesterolemia   . GERD (gastroesophageal reflux disease)   . COPD (chronic obstructive pulmonary disease) [facility])    Past Surgical History  Procedure Laterality Date  . Tubal ligation    . Tonsillectomy    . Cholecystectomy     Family History  Problem Relation Age of Onset  . Breast cancer Maternal Aunt 86   Social History  Substance Use Topics  . Smoking status: Former Research scientist (life sciences)  . Smokeless tobacco: Never Used  . Alcohol Use: No   OB History    No data available     Review of Systems  Allergies  Review of patient's allergies indicates no known allergies.  Home Medications   Prior to Admission medications   Medication Sig Start Date End Date Taking? Authorizing Provider  albuterol (PROVENTIL HFA;VENTOLIN HFA) 108 (90 BASE) MCG/ACT inhaler Inhale 2 puffs into the lungs every 6 (six) hours as needed for wheezing.   Yes Historical Provider, MD  atorvastatin (LIPITOR) 10 MG tablet Take 1 tablet (10 mg total) by mouth daily. 11/08/12  Yes Einar Pheasant, MD  citalopram (CELEXA) 40 MG tablet Take 1.5 tablets (60 mg total) by mouth daily. 09/21/12  Yes Einar Pheasant, MD  cyclobenzaprine (FLEXERIL) 10 MG tablet Take 1 tablet (10 mg total) by mouth 3 (three) times daily as needed. 03/06/12  Yes Einar Pheasant, MD   esomeprazole (NEXIUM) 40 MG capsule Take 1 capsule (40 mg total) by mouth daily. 12/02/12  Yes Einar Pheasant, MD  hydrochlorothiazide (HYDRODIURIL) 25 MG tablet Take 1 tablet (25 mg total) by mouth daily. 09/30/12  Yes Einar Pheasant, MD  ramipril (ALTACE) 2.5 MG capsule Take 1 capsule (2.5 mg total) by mouth daily. 11/08/12  Yes Einar Pheasant, MD  SPIRIVA HANDIHALER 18 MCG inhalation capsule INHALE THE CONTENTS OF 1 CAPSULE DAILY 12/21/12  Yes Einar Pheasant, MD  traZODone (DESYREL) 150 MG tablet TAKE 1 TABLET (150 MG TOTAL) AT BEDTIME 01/07/13  Yes Einar Pheasant, MD  doxycycline (VIBRA-TABS) 100 MG tablet Take 1 tablet (100 mg total) by mouth 2 (two) times daily. 05/10/15   Norval Gable, MD  predniSONE (DELTASONE) 20 MG tablet 3 tabs po qd for 2 days, then 2 tabs po qd for 3 days, then 1 tab po qd for 3 days, then half tab po qd for 3 days 05/10/15   Norval Gable, MD   Meds Ordered and Administered this Visit   Medications  ipratropium-albuterol (DUONEB) 0.5-2.5 (3) MG/3ML nebulizer solution 3 mL (3 mLs Nebulization Given 05/10/15 1856)    BP 140/102 mmHg  Pulse 80  Temp(Src) 97.8 F (36.6 C) (Oral)  Resp 20  Ht 5\' 5"  (1.651 m)  Wt 177 lb (80.287 kg)  BMI 29.45 kg/m2  SpO2 96% No data found.   Physical Exam  Constitutional: She appears well-developed and well-nourished. No distress.  HENT:  Head: Normocephalic and  atraumatic.  Right Ear: Tympanic membrane, external ear and ear canal normal.  Left Ear: Tympanic membrane, external ear and ear canal normal.  Nose: No mucosal edema, rhinorrhea, nose lacerations, sinus tenderness, nasal deformity, septal deviation or nasal septal hematoma. No epistaxis.  No foreign bodies.  Mouth/Throat: Uvula is midline, oropharynx is clear and moist and mucous membranes are normal. No oropharyngeal exudate or posterior oropharyngeal erythema.  Eyes: Conjunctivae and EOM are normal. Pupils are equal, round, and reactive to light. Right eye exhibits no  discharge. Left eye exhibits no discharge. No scleral icterus.  Neck: Normal range of motion. Neck supple. No thyromegaly present.  Cardiovascular: Normal rate, regular rhythm and normal heart sounds.   Pulmonary/Chest: Effort normal. No respiratory distress. She has wheezes (diffuse expiratory wheezes bilaterally). She has no rales.  Lymphadenopathy:    She has no cervical adenopathy.  Skin: She is not diaphoretic.  Nursing note and vitals reviewed.   ED Course  Procedures (including critical care time)  Labs Review Labs Reviewed - No data to display  Imaging Review No results found.   Visual Acuity Review  Right Eye Distance:   Left Eye Distance:   Bilateral Distance:    Right Eye Near:   Left Eye Near:    Bilateral Near:         MDM   1. COPD exacerbation [facility])     Discharge Medication List as of 05/10/2015  7:39 PM    START taking these medications   Details  doxycycline (VIBRA-TABS) 100 MG tablet Take 1 tablet (100 mg total) by mouth 2 (two) times daily., Starting 05/10/2015, Until Discontinued, Normal       1. diagnosis reviewed with patient 2. rx as per orders above; reviewed possible side effects, interactions, risks and benefits;rx for longer prednisone course with taper 3. Recommend supportive treatment with rest, increased fluids 4. Follow-up prn if symptoms worsen or don 't improve   Norval Gable, MD 05/10/15 2057

## 2015-07-15 ENCOUNTER — Encounter: Payer: Self-pay | Admitting: Emergency Medicine

## 2015-07-15 ENCOUNTER — Ambulatory Visit
Admission: EM | Admit: 2015-07-15 | Discharge: 2015-07-15 | Disposition: A | Discharge status: Home / Self Care | Payer: BLUE CROSS/BLUE SHIELD | Attending: Family Medicine | Admitting: Family Medicine

## 2015-07-15 DIAGNOSIS — J0100 Acute maxillary sinusitis, unspecified: Secondary | ICD-10-CM | POA: Diagnosis not present

## 2015-07-15 MED ORDER — PREDNISONE 10 MG (21) PO TBPK: ORAL_TABLET | ORAL | Status: DC

## 2015-07-15 MED ORDER — AMOXICILLIN-POT CLAVULANATE 875-125 MG PO TABS: 1.0000 | ORAL_TABLET | Freq: Two times a day (BID) | ORAL | Status: AC

## 2015-07-15 MED ORDER — FEXOFENADINE-PSEUDOEPHED ER 180-240 MG PO TB24
1.0000 | ORAL_TABLET | Freq: Every day | ORAL | Status: DC
Start: 2015-07-15 — End: 2017-06-08

## 2015-07-15 MED ORDER — FLUTICASONE PROPIONATE 50 MCG/ACT NA SUSP: 2.0000 | Freq: Every day | NASAL | Status: DC

## 2015-07-15 NOTE — Discharge Instructions (Signed)
Sinusitis, Adult  Sinusitis is redness, soreness, and puffiness (inflammation) of the air pockets in the bones of your face (sinuses). The redness, soreness, and puffiness can cause air and mucus to get trapped in your sinuses. This can allow germs to grow and cause an infection.   HOME CARE    Drink enough fluids to keep your pee (urine) clear or pale yellow.   Use a humidifier in your home.   Run a hot shower to create steam in the bathroom. Sit in the bathroom with the door closed. Breathe in the steam 3-4 times a day.   Put a warm, moist washcloth on your face 3-4 times a day, or as told by your doctor.   Use salt water sprays (saline sprays) to wet the thick fluid in your nose. This can help the sinuses drain.   Only take medicine as told by your doctor.  GET HELP RIGHT AWAY IF:    Your pain gets worse.   You have very bad headaches.   You are sick to your stomach (nauseous).   You throw up (vomit).   You are very sleepy (drowsy) all the time.   Your face is puffy (swollen).   Your vision changes.   You have a stiff neck.   You have trouble breathing.  MAKE SURE YOU:    Understand these instructions.   Will watch your condition.   Will get help right away if you are not doing well or get worse.     This information is not intended to replace advice given to you by your health care provider. Make sure you discuss any questions you have with your health care provider.     Document Released: 09/20/2007 Document Revised: 04/24/2014 Document Reviewed: 11/07/2011  Elsevier Interactive Patient Education 2016 [employer]

## 2015-07-15 NOTE — ED Provider Notes (Signed)
CSN: [id number]     Arrival date & time 07/15/15  [id number] History   First MD Initiated Contact with Patient 07/15/15 [id number]    Nurses notes were reviewed. Chief Complaint  Patient presents with  . Facial Pain    Patient reports Sunday she was outside quite a bit for several hours. Does a lot of pollen but today was beautiful she states Mondayand facial congestion and swelling she's been blowing white clear mucus out of her nostril nothing really going to her lungs because she has a history of COPD from previously smoking she was worried so she came in to be seen and evaluated. She's had tubal ligation tonsillectomy cholecystectomy. Along COPD she has history of GERD hypercholesterolemia and hypertension. Maternal breast cancer.    (Consider location/radiation/quality/duration/timing/severity/associated sxs/prior Treatment) Patient is a 60 y.o. female presenting with URI. The history is provided by the patient. No language interpreter was used.  URI Presenting symptoms: congestion, ear pain and rhinorrhea   Presenting symptoms: no cough, no fatigue, no fever and no sore throat   Severity:  Moderate Chronicity:  New Worsened by:  Nothing tried Associated symptoms: sinus pain and swollen glands   Associated symptoms: no arthralgias, no headaches, no myalgias and no neck pain   Risk factors: no recent illness     Past Medical History  Diagnosis Date  . Hypertension   . Hypercholesterolemia   . GERD (gastroesophageal reflux disease)   . COPD (chronic obstructive pulmonary disease) [facility])    Past Surgical History  Procedure Laterality Date  . Tubal ligation    . Tonsillectomy    . Cholecystectomy     Family History  Problem Relation Age of Onset  . Breast cancer Maternal Aunt 58   Social History  Substance Use Topics  . Smoking status: Former Research scientist (life sciences)  . Smokeless tobacco: Never Used  . Alcohol Use: No   OB History    No data available     Review of Systems  Constitutional:  Negative for fever and fatigue.  HENT: Positive for congestion, ear pain and rhinorrhea. Negative for sore throat.   Respiratory: Negative for cough.   Musculoskeletal: Negative for myalgias, arthralgias and neck pain.  Neurological: Negative for headaches.  All other systems reviewed and are negative.   Allergies  Review of patient's allergies indicates no known allergies.  Home Medications   Prior to Admission medications   Medication Sig Start Date End Date Taking? Authorizing Provider  albuterol (PROVENTIL HFA;VENTOLIN HFA) 108 (90 BASE) MCG/ACT inhaler Inhale 2 puffs into the lungs every 6 (six) hours as needed for wheezing.   Yes Historical Provider, MD  atorvastatin (LIPITOR) 10 MG tablet Take 1 tablet (10 mg total) by mouth daily. 11/08/12  Yes Einar Pheasant, MD  citalopram (CELEXA) 40 MG tablet Take 1.5 tablets (60 mg total) by mouth daily. 09/21/12  Yes Einar Pheasant, MD  cyclobenzaprine (FLEXERIL) 10 MG tablet Take 1 tablet (10 mg total) by mouth 3 (three) times daily as needed. 03/06/12  Yes Einar Pheasant, MD  esomeprazole (NEXIUM) 40 MG capsule Take 1 capsule (40 mg total) by mouth daily. 12/02/12  Yes Einar Pheasant, MD  hydrochlorothiazide (HYDRODIURIL) 25 MG tablet Take 1 tablet (25 mg total) by mouth daily. 09/30/12  Yes Einar Pheasant, MD  ramipril (ALTACE) 2.5 MG capsule Take 1 capsule (2.5 mg total) by mouth daily. 11/08/12  Yes Einar Pheasant, MD  SPIRIVA HANDIHALER 18 MCG inhalation capsule INHALE THE CONTENTS OF 1 CAPSULE DAILY 12/21/12  Yes Charlene  Nicki Reaper, MD  traZODone (DESYREL) 150 MG tablet TAKE 1 TABLET (150 MG TOTAL) AT BEDTIME 01/07/13  Yes Einar Pheasant, MD  amoxicillin-clavulanate (AUGMENTIN) 875-125 MG tablet Take 1 tablet by mouth 2 (two) times daily. If not better by 07/17/2015 may fill 07/17/15 08/16/15  Frederich Cha, MD  doxycycline (VIBRA-TABS) 100 MG tablet Take 1 tablet (100 mg total) by mouth 2 (two) times daily. 05/10/15   Norval Gable, MD   fexofenadine-pseudoephedrine (ALLEGRA-D ALLERGY & CONGESTION) 180-240 MG 24 hr tablet Take 1 tablet by mouth daily. 07/15/15   Frederich Cha, MD  fluticasone (FLONASE) 50 MCG/ACT nasal spray Place 2 sprays into both nostrils daily. 07/15/15   Frederich Cha, MD  predniSONE (DELTASONE) 20 MG tablet 3 tabs po qd for 2 days, then 2 tabs po qd for 3 days, then 1 tab po qd for 3 days, then half tab po qd for 3 days 05/10/15   Norval Gable, MD  predniSONE (STERAPRED UNI-PAK 21 TAB) 10 MG (21) TBPK tablet Sig 6 tablet day 1, 5 tablets day 2, 4 tablets day 3,,3tablets day 4, 2 tablets day 5, 1 tablet day 6 take all tablets orally 07/15/15   Frederich Cha, MD   Meds Ordered and Administered this Visit  Medications - No data to display  BP 96/80 mmHg  Pulse 80  Temp(Src) 96.7 F (35.9 C) (Tympanic)  Resp 18  Ht 5\' 4"  (1.626 m)  Wt 176 lb (79.833 kg)  BMI 30.20 kg/m2  SpO2 98% No data found.   Physical Exam  Constitutional: She is oriented to person, place, and time. She appears well-developed and well-nourished.  HENT:  Head: Normocephalic and atraumatic.  Right Ear: External ear normal.  Eyes: Conjunctivae are normal. Pupils are equal, round, and reactive to light.  Neck: Neck supple. No tracheal deviation present. No thyromegaly present.  Cardiovascular: Normal rate and regular rhythm.   Pulmonary/Chest: Effort normal.  Musculoskeletal: Normal range of motion.  Lymphadenopathy:    She has cervical adenopathy.  Neurological: She is alert and oriented to person, place, and time.  Skin: Skin is warm and dry.  Psychiatric: She has a normal mood and affect.  Vitals reviewed.   ED Course  Procedures (including critical care time)  Labs Review Labs Reviewed - No data to display  Imaging Review No results found.   Visual Acuity Review  Right Eye Distance:   Left Eye Distance:   Bilateral Distance:    Right Eye Near:   Left Eye Near:    Bilateral Near:         MDM   1. Acute  maxillary sinusitis, recurrence not specified     Patient be placed on prednisone for 6 days, Flonase nasal spray with instructions how to use it, Allegra-D 24 hours daily and postdated prescription for Augmentin she didn't fill Saturday, April 1 if needed. Patient was explained that antibiotics can see use sinus infection until symptoms present for 7-10 days.Frederich Cha, MD 07/15/15 1019

## 2015-07-15 NOTE — ED Notes (Signed)
Patient states she has had a headache and sinus pain, sneezing and drainage for the past several days.

## 2015-09-27 ENCOUNTER — Other Ambulatory Visit: Payer: Self-pay | Admitting: Gastroenterology

## 2015-09-27 DIAGNOSIS — R1084 Generalized abdominal pain: Secondary | ICD-10-CM

## 2015-10-13 ENCOUNTER — Ambulatory Visit: Payer: BLUE CROSS/BLUE SHIELD

## 2015-12-03 ENCOUNTER — Ambulatory Visit: Payer: BLUE CROSS/BLUE SHIELD

## 2015-12-03 ENCOUNTER — Encounter: Payer: Self-pay | Admitting: *Deleted

## 2015-12-03 ENCOUNTER — Ambulatory Visit
Admit: 2015-12-03 | Discharge: 2015-12-03 | Disposition: A | Discharge status: Home / Self Care | Payer: BLUE CROSS/BLUE SHIELD | Attending: Family Medicine | Admitting: Family Medicine

## 2015-12-03 ENCOUNTER — Ambulatory Visit
Admission: EM | Admit: 2015-12-03 | Discharge: 2015-12-03 | Payer: BLUE CROSS/BLUE SHIELD | Attending: Family Medicine | Admitting: Family Medicine

## 2015-12-03 DIAGNOSIS — M79661 Pain in right lower leg: Secondary | ICD-10-CM

## 2015-12-03 DIAGNOSIS — M79604 Pain in right leg: Secondary | ICD-10-CM | POA: Diagnosis not present

## 2015-12-03 DIAGNOSIS — M79606 Pain in leg, unspecified: Secondary | ICD-10-CM | POA: Diagnosis present

## 2015-12-03 MED ORDER — HYDROCODONE-ACETAMINOPHEN 5-325 MG PO TABS: 1.0000 | ORAL_TABLET | Freq: Four times a day (QID) | ORAL | 0 refills | Status: DC | PRN

## 2015-12-03 NOTE — ED Triage Notes (Signed)
Patient started having right leg pain posterior to the right knee last night. Patient has had a history of right leg pain in the same general location, but the pain goes away. Patient has had vascular surgery performed on her left leg for pain resolution. No history of DVT.

## 2015-12-03 NOTE — Discharge Instructions (Signed)
If [place] negative; take motrin as needed and pain medication as needed; follow up with pcp

## 2015-12-03 NOTE — ED Provider Notes (Signed)
[facility]    CSN: [id number] Arrival date & time: 12/03/15  [id number]  First Provider Contact:  None       History   Chief Complaint Chief Complaint  Patient presents with  . Leg Pain    HPI Veronica Kennedy is a 60 y.o. female.   The history is provided by the patient.  Leg Pain  Patient started having right leg pain posterior to the right knee last night. Patient has had a history of right leg pain in the same general location, but the pain goes away. Patient has had vascular surgery performed on her left leg for pain resolution. No history of DVT. Denies any falls, trauma.  Past Medical History:  Diagnosis Date  . COPD (chronic obstructive pulmonary disease) ([place])   . GERD (gastroesophageal reflux disease)   . Hypercholesterolemia   . Hypertension     Patient Active Problem List   Diagnosis Date Noted  . GERD (gastroesophageal reflux disease) 09/21/2012  . Stress 09/21/2012  . Essential hypertension, benign 09/19/2012  . Hypercholesterolemia 09/19/2012    Past Surgical History:  Procedure Laterality Date  . CHOLECYSTECTOMY    . TONSILLECTOMY    . TUBAL LIGATION      OB History    No data available       Home Medications    Prior to Admission medications   Medication Sig Start Date End Date Taking? Authorizing Provider  albuterol (PROVENTIL HFA;VENTOLIN HFA) 108 (90 BASE) MCG/ACT inhaler Inhale 2 puffs into the lungs every 6 (six) hours as needed for wheezing.   Yes Historical Provider, MD  atorvastatin (LIPITOR) 10 MG tablet Take 1 tablet (10 mg total) by mouth daily. 11/08/12  Yes Einar Pheasant, MD  citalopram (CELEXA) 40 MG tablet Take 1.5 tablets (60 mg total) by mouth daily. 09/21/12  Yes Einar Pheasant, MD  cyclobenzaprine (FLEXERIL) 10 MG tablet Take 1 tablet (10 mg total) by mouth 3 (three) times daily as needed. 03/06/12  Yes Einar Pheasant, MD  hydrochlorothiazide (HYDRODIURIL) 25 MG tablet Take 1 tablet (25 mg total) by mouth daily.  09/30/12  Yes Einar Pheasant, MD  ramipril (ALTACE) 2.5 MG capsule Take 1 capsule (2.5 mg total) by mouth daily. 11/08/12  Yes Einar Pheasant, MD  ranitidine (ZANTAC) 150 MG capsule Take 150 mg by mouth daily.   Yes Historical Provider, MD  SPIRIVA HANDIHALER 18 MCG inhalation capsule INHALE THE CONTENTS OF 1 CAPSULE DAILY 12/21/12  Yes Einar Pheasant, MD  traZODone (DESYREL) 150 MG tablet TAKE 1 TABLET (150 MG TOTAL) AT BEDTIME 01/07/13  Yes Einar Pheasant, MD  doxycycline (VIBRA-TABS) 100 MG tablet Take 1 tablet (100 mg total) by mouth 2 (two) times daily. 05/10/15   Norval Gable, MD  esomeprazole (NEXIUM) 40 MG capsule Take 1 capsule (40 mg total) by mouth daily. 12/02/12   Einar Pheasant, MD  fexofenadine-pseudoephedrine (ALLEGRA-D ALLERGY & CONGESTION) 180-240 MG 24 hr tablet Take 1 tablet by mouth daily. 07/15/15   Frederich Cha, MD  fluticasone (FLONASE) 50 MCG/ACT nasal spray Place 2 sprays into both nostrils daily. 07/15/15   Frederich Cha, MD  HYDROcodone-acetaminophen (NORCO/VICODIN) 5-325 MG tablet Take 1-2 tablets by mouth every 6 (six) hours as needed. 12/03/15   Norval Gable, MD  predniSONE (DELTASONE) 20 MG tablet 3 tabs po qd for 2 days, then 2 tabs po qd for 3 days, then 1 tab po qd for 3 days, then half tab po qd for 3 days 05/10/15   Norval Gable, MD  predniSONE (STERAPRED UNI-PAK 21 TAB) 10 MG (21) TBPK tablet Sig 6 tablet day 1, 5 tablets day 2, 4 tablets day 3,,3tablets day 4, 2 tablets day 5, 1 tablet day 6 take all tablets orally 07/15/15   Frederich Cha, MD    Family History Family History  Problem Relation Age of Onset  . Breast cancer Maternal Aunt 50    Social History Social History  Substance Use Topics  . Smoking status: Former Research scientist (life sciences)  . Smokeless tobacco: Never Used  . Alcohol use No     Allergies   Review of patient's allergies indicates no known allergies.   Review of Systems Review of Systems   Physical Exam Triage Vital Signs ED Triage Vitals  Enc Vitals  Group     BP 12/03/15 0949 123/72     Pulse Rate 12/03/15 0949 79     Resp 12/03/15 0949 18     Temp 12/03/15 0949 98.3 F (36.8 C)     Temp Source 12/03/15 0949 Oral     SpO2 12/03/15 0949 98 %     Weight 12/03/15 0950 172 lb (78 kg)     Height 12/03/15 0950 5\' 4"  (1.626 m)     Head Circumference --      Peak Flow --      Pain Score 12/03/15 0957 5     Pain Loc --      Pain Edu? --      Excl. in [place]? --    No data found.   Updated Vital Signs BP 123/72 (BP Location: Left Arm)   Pulse 79   Temp 98.3 F (36.8 C) (Oral)   Resp 18   Ht 5\' 4"  (1.626 m)   Wt 172 lb (78 kg)   SpO2 98%   BMI 29.52 kg/m   Visual Acuity Right Eye Distance:   Left Eye Distance:   Bilateral Distance:    Right Eye Near:   Left Eye Near:    Bilateral Near:     Physical Exam  Constitutional: She appears well-developed and well-nourished. No distress.  Cardiovascular: Intact distal pulses.   Posterior tibialis pulses auscultated with doppler  Musculoskeletal: She exhibits no edema.       Right lower leg: She exhibits tenderness (calf tenderness; ) and swelling (mild). She exhibits no bony tenderness, no deformity and no laceration.  Skin: She is not diaphoretic.  Nursing note and vitals reviewed.    UC Treatments / Results  Labs (all labs ordered are listed, but only abnormal results are displayed) Labs Reviewed - No data to display  EKG  EKG Interpretation None       Radiology No results found.  Procedures Procedures (including critical care time)  Medications Ordered in UC Medications - No data to display   Initial Impression / Assessment and Plan / UC Course  I have reviewed the triage vital signs and the nursing notes.  Pertinent labs & imaging results that were available during my care of the patient were reviewed by me and considered in my medical decision making (see chart for details).  Clinical Course      Final Clinical Impressions(s) / UC Diagnoses    Final diagnoses:  Calf pain, right    New Prescriptions New Prescriptions   HYDROCODONE-ACETAMINOPHEN (NORCO/VICODIN) 5-325 MG TABLET    Take 1-2 tablets by mouth every 6 (six) hours as needed.   1. possible diagnoses reviewed with patient 2. Recommend venous doppler US to rule out DVT; further management  pending [place] results   Norval Gable, MD 12/03/15 [id number]

## 2016-01-06 ENCOUNTER — Other Ambulatory Visit: Payer: Self-pay | Admitting: Family Medicine

## 2016-01-06 DIAGNOSIS — Z1231 Encounter for screening mammogram for malignant neoplasm of breast: Secondary | ICD-10-CM

## 2016-01-26 ENCOUNTER — Ambulatory Visit
Admission: RE | Admit: 2016-01-26 | Discharge: 2016-01-26 | Disposition: A | Discharge status: Home / Self Care | Payer: BLUE CROSS/BLUE SHIELD | Source: Ambulatory Visit | Attending: Family Medicine | Admitting: Family Medicine

## 2016-01-26 ENCOUNTER — Ambulatory Visit
Admission: RE | Admit: 2016-01-26 | Discharge: 2016-01-26 | Disposition: A | Discharge status: Home / Self Care | Payer: BLUE CROSS/BLUE SHIELD | Source: Ambulatory Visit | Attending: Gastroenterology | Admitting: Gastroenterology

## 2016-01-26 DIAGNOSIS — R1084 Generalized abdominal pain: Secondary | ICD-10-CM | POA: Diagnosis not present

## 2016-01-26 DIAGNOSIS — I700 Atherosclerosis of aorta: Secondary | ICD-10-CM | POA: Diagnosis not present

## 2016-01-26 DIAGNOSIS — K760 Fatty (change of) liver, not elsewhere classified: Secondary | ICD-10-CM | POA: Diagnosis not present

## 2016-01-26 DIAGNOSIS — Z1231 Encounter for screening mammogram for malignant neoplasm of breast: Secondary | ICD-10-CM

## 2016-01-26 DIAGNOSIS — D3502 Benign neoplasm of left adrenal gland: Secondary | ICD-10-CM | POA: Diagnosis not present

## 2016-01-26 DIAGNOSIS — K439 Ventral hernia without obstruction or gangrene: Secondary | ICD-10-CM | POA: Diagnosis not present

## 2016-01-26 DIAGNOSIS — K449 Diaphragmatic hernia without obstruction or gangrene: Secondary | ICD-10-CM | POA: Diagnosis not present

## 2016-01-26 DIAGNOSIS — R932 Abnormal findings on diagnostic imaging of liver and biliary tract: Secondary | ICD-10-CM | POA: Insufficient documentation

## 2016-01-26 DIAGNOSIS — I708 Atherosclerosis of other arteries: Secondary | ICD-10-CM | POA: Diagnosis not present

## 2016-01-26 DIAGNOSIS — M48061 Spinal stenosis, lumbar region without neurogenic claudication: Secondary | ICD-10-CM | POA: Insufficient documentation

## 2016-01-26 DIAGNOSIS — K219 Gastro-esophageal reflux disease without esophagitis: Secondary | ICD-10-CM | POA: Diagnosis not present

## 2016-01-26 HISTORY — DX: Malignant melanoma of skin, unspecified: C43.9

## 2016-01-26 MED ORDER — IOPAMIDOL (ISOVUE-300) INJECTION 61%: 100.0000 mL | Freq: Once | INTRAVENOUS | Status: DC | PRN

## 2016-01-27 ENCOUNTER — Ambulatory Visit: Payer: BLUE CROSS/BLUE SHIELD

## 2016-03-01 ENCOUNTER — Other Ambulatory Visit: Payer: Self-pay | Admitting: Gastroenterology

## 2016-03-01 DIAGNOSIS — K760 Fatty (change of) liver, not elsewhere classified: Secondary | ICD-10-CM

## 2016-04-07 ENCOUNTER — Ambulatory Visit: Payer: BLUE CROSS/BLUE SHIELD

## 2017-01-17 ENCOUNTER — Other Ambulatory Visit: Payer: Self-pay | Admitting: Family Medicine

## 2017-01-17 DIAGNOSIS — Z1231 Encounter for screening mammogram for malignant neoplasm of breast: Secondary | ICD-10-CM

## 2017-01-30 ENCOUNTER — Ambulatory Visit
Admission: RE | Admit: 2017-01-30 | Discharge: 2017-01-30 | Disposition: A | Discharge status: Home / Self Care | Payer: BLUE CROSS/BLUE SHIELD | Source: Ambulatory Visit | Attending: Family Medicine | Admitting: Family Medicine

## 2017-01-30 DIAGNOSIS — Z1231 Encounter for screening mammogram for malignant neoplasm of breast: Secondary | ICD-10-CM | POA: Insufficient documentation

## 2017-06-08 ENCOUNTER — Other Ambulatory Visit: Payer: Self-pay

## 2017-06-08 ENCOUNTER — Ambulatory Visit
Admission: EM | Admit: 2017-06-08 | Discharge: 2017-06-08 | Disposition: A | Discharge status: Home / Self Care | Payer: BLUE CROSS/BLUE SHIELD | Attending: Emergency Medicine | Admitting: Emergency Medicine

## 2017-06-08 ENCOUNTER — Encounter: Payer: Self-pay | Admitting: Emergency Medicine

## 2017-06-08 DIAGNOSIS — L089 Local infection of the skin and subcutaneous tissue, unspecified: Secondary | ICD-10-CM | POA: Diagnosis not present

## 2017-06-08 DIAGNOSIS — L988 Other specified disorders of the skin and subcutaneous tissue: Secondary | ICD-10-CM | POA: Diagnosis not present

## 2017-06-08 MED ORDER — CHLORHEXIDINE GLUCONATE 4 % EX LIQD: Freq: Every day | CUTANEOUS | 0 refills | Status: DC | PRN

## 2017-06-08 MED ORDER — DOXYCYCLINE HYCLATE 100 MG PO CAPS: 100.0000 mg | ORAL_CAPSULE | Freq: Two times a day (BID) | ORAL | 0 refills | Status: AC

## 2017-06-08 NOTE — ED Triage Notes (Signed)
Patient in today c/o 1 month rash/sore on lower right leg. Patient has tried OTC Neosporin.

## 2017-06-08 NOTE — ED Provider Notes (Signed)
HPI  SUBJECTIVE:  Veronica Kennedy is a 62 y.o. female who presents with multiple red sores on her right lower extremity for the past month.  She states that it is occasionally painful with palpation.  It does not itch or burn.  She notes increased redness, localized swelling.  She has tried peroxide, Neosporin, Band-Aids, ibuprofen with temporary improvement.  No aggravating factors.  No trauma to the area, known insect bite.  No fevers, body aches, rash elsewhere.  No contacts with MRSA.  She took ibuprofen within 6-8 hours prior to evaluation.  Past medical history negative for MRSA, diabetes, peripheral arterial disease, peripheral vascular disease.  She has a past medical history of hypertension.  YHC:WCBJSEG, Ishmael Holter, MD    Past Medical History:  Diagnosis Date  . COPD (chronic obstructive pulmonary disease) ([place])   . GERD (gastroesophageal reflux disease)   . Hypercholesterolemia   . Hypertension   . Melanoma ([place])    approximately 8 years ago resected from Right lower leg.     Past Surgical History:  Procedure Laterality Date  . CHOLECYSTECTOMY    . TONSILLECTOMY    . TUBAL LIGATION      Family History  Problem Relation Age of Onset  . Breast cancer Maternal Aunt 26  . Dementia Mother   . Heart disease Mother   . Diabetes Mother   . Heart attack Father   . AAA (abdominal aortic aneurysm) Father   . Hypertension Father   . Hyperlipidemia Father     Social History   Tobacco Use  . Smoking status: Former Smoker    Last attempt to quit: 06/08/2005    Years since quitting: 12.0  . Smokeless tobacco: Never Used  Substance Use Topics  . Alcohol use: No  . Drug use: No    No current facility-administered medications for this encounter.   Current Outpatient Medications:  .  albuterol (PROVENTIL HFA;VENTOLIN HFA) 108 (90 BASE) MCG/ACT inhaler, Inhale 2 puffs into the lungs every 6 (six) hours as needed for wheezing., Disp: , Rfl:  .  atorvastatin (LIPITOR) 10 MG tablet,  Take 1 tablet (10 mg total) by mouth daily., Disp: 90 tablet, Rfl: 1 .  citalopram (CELEXA) 40 MG tablet, Take 1.5 tablets (60 mg total) by mouth daily., Disp: 135 tablet, Rfl: 1 .  cyclobenzaprine (FLEXERIL) 10 MG tablet, Take 1 tablet (10 mg total) by mouth 3 (three) times daily as needed., Disp: 90 tablet, Rfl: 3 .  esomeprazole (NEXIUM) 40 MG capsule, Take 1 capsule (40 mg total) by mouth daily., Disp: 90 capsule, Rfl: 1 .  fluticasone (FLONASE) 50 MCG/ACT nasal spray, Place 2 sprays into both nostrils daily., Disp: 16 g, Rfl: 2 .  hydrochlorothiazide (HYDRODIURIL) 25 MG tablet, Take 1 tablet (25 mg total) by mouth daily., Disp: 90 tablet, Rfl: 1 .  ramipril (ALTACE) 2.5 MG capsule, Take 1 capsule (2.5 mg total) by mouth daily., Disp: 90 capsule, Rfl: 1 .  SPIRIVA HANDIHALER 18 MCG inhalation capsule, INHALE THE CONTENTS OF 1 CAPSULE DAILY, Disp: 1 capsule, Rfl: 5 .  traZODone (DESYREL) 150 MG tablet, TAKE 1 TABLET (150 MG TOTAL) AT BEDTIME, Disp: 90 tablet, Rfl: 0 .  chlorhexidine (HIBICLENS) 4 % external liquid, Apply topically daily as needed. Dilute 10-15 mL in water, Use daily when bathing for 1-2 weeks, Disp: 120 mL, Rfl: 0 .  doxycycline (VIBRAMYCIN) 100 MG capsule, Take 1 capsule (100 mg total) by mouth 2 (two) times daily for 7 days., Disp: 14 capsule,  Rfl: 0  No Known Allergies   ROS  As noted in HPI.   Physical Exam  BP 120/68 (BP Location: Left Arm)   Pulse 80   Temp 97.6 F (36.4 C) (Oral)   Resp 16   Ht 5\' 5"  (1.651 m)   Wt 170 lb (77.1 kg)   SpO2 98%   BMI 28.29 kg/m   Constitutional: Well developed, well nourished, no acute distress Eyes:  EOMI, conjunctiva normal bilaterally HENT: Normocephalic, atraumatic,mucus membranes moist Respiratory: Normal inspiratory effort Cardiovascular: Normal rate GI: nondistended Skin: 3-4 areas of flat, tender edematous macules, one with a central blister on right lower extremity.  No crusting, induration, fluctuance.   Otherwise normal skin color.  See picture.     Musculoskeletal: no deformities Neurologic: Alert & oriented x 3, no focal neuro deficits Psychiatric: Speech and behavior appropriate   ED Course   Medications - No data to display  No orders of the defined types were placed in this encounter.   No results found for this or any previous visit (from the past 24 hour(s)). No results found.  ED Clinical Impression  Skin infection   ED Assessment/Plan  Concern for MRSA infection.  There is nothing to I&D today.  Will send home with doxycycline for 7 days, Hibiclens.  Patient is to continue Neosporin on the lesions, discontinue the hydrogen peroxide.  Follow-up with her primary care physician if not better in a week, to the ER if she gets worse.  Discussed  MDM, plan and followup with patient. Discussed sn/sx that should prompt return to the ED. patient agrees with plan.   Meds ordered this encounter  Medications  . chlorhexidine (HIBICLENS) 4 % external liquid    Sig: Apply topically daily as needed. Dilute 10-15 mL in water, Use daily when bathing for 1-2 weeks    Dispense:  120 mL    Refill:  0  . doxycycline (VIBRAMYCIN) 100 MG capsule    Sig: Take 1 capsule (100 mg total) by mouth 2 (two) times daily for 7 days.    Dispense:  14 capsule    Refill:  0    *This clinic note was created using [employer]. Therefore, there may be occasional mistakes despite careful proofreading.   ?   Melynda Ripple, MD 06/08/17 [id number]

## 2018-01-17 ENCOUNTER — Other Ambulatory Visit: Payer: Self-pay | Admitting: Family Medicine

## 2018-01-17 DIAGNOSIS — Z1231 Encounter for screening mammogram for malignant neoplasm of breast: Secondary | ICD-10-CM

## 2018-02-07 ENCOUNTER — Ambulatory Visit
Admission: RE | Admit: 2018-02-07 | Discharge: 2018-02-07 | Disposition: A | Discharge status: Home / Self Care | Payer: BLUE CROSS/BLUE SHIELD | Source: Ambulatory Visit | Attending: Family Medicine | Admitting: Family Medicine

## 2018-02-07 DIAGNOSIS — Z1231 Encounter for screening mammogram for malignant neoplasm of breast: Secondary | ICD-10-CM | POA: Insufficient documentation

## 2018-02-21 ENCOUNTER — Other Ambulatory Visit: Payer: Self-pay | Admitting: Gastroenterology

## 2018-02-21 DIAGNOSIS — K760 Fatty (change of) liver, not elsewhere classified: Secondary | ICD-10-CM

## 2018-02-21 DIAGNOSIS — K219 Gastro-esophageal reflux disease without esophagitis: Secondary | ICD-10-CM

## 2018-03-13 ENCOUNTER — Other Ambulatory Visit: Payer: Self-pay | Admitting: Gastroenterology

## 2018-03-13 DIAGNOSIS — K760 Fatty (change of) liver, not elsewhere classified: Secondary | ICD-10-CM

## 2018-03-13 DIAGNOSIS — K219 Gastro-esophageal reflux disease without esophagitis: Secondary | ICD-10-CM

## 2018-03-18 ENCOUNTER — Ambulatory Visit: Payer: BLUE CROSS/BLUE SHIELD

## 2018-03-18 ENCOUNTER — Ambulatory Visit
Admission: RE | Admit: 2018-03-18 | Discharge: 2018-03-18 | Disposition: A | Discharge status: Home / Self Care | Payer: BLUE CROSS/BLUE SHIELD | Source: Ambulatory Visit | Attending: Gastroenterology | Admitting: Gastroenterology

## 2018-03-18 DIAGNOSIS — K760 Fatty (change of) liver, not elsewhere classified: Secondary | ICD-10-CM | POA: Diagnosis not present

## 2018-03-18 DIAGNOSIS — Z9049 Acquired absence of other specified parts of digestive tract: Secondary | ICD-10-CM | POA: Diagnosis not present

## 2018-03-18 DIAGNOSIS — K219 Gastro-esophageal reflux disease without esophagitis: Secondary | ICD-10-CM | POA: Diagnosis not present

## 2018-04-20 ENCOUNTER — Ambulatory Visit: Payer: BLUE CROSS/BLUE SHIELD

## 2018-04-20 ENCOUNTER — Ambulatory Visit
Admission: EM | Admit: 2018-04-20 | Discharge: 2018-04-20 | Disposition: A | Discharge status: Home / Self Care | Payer: BLUE CROSS/BLUE SHIELD | Attending: Student | Admitting: Student

## 2018-04-20 ENCOUNTER — Other Ambulatory Visit: Payer: Self-pay

## 2018-04-20 ENCOUNTER — Encounter: Payer: Self-pay | Admitting: Emergency Medicine

## 2018-04-20 DIAGNOSIS — R05 Cough: Secondary | ICD-10-CM

## 2018-04-20 DIAGNOSIS — J069 Acute upper respiratory infection, unspecified: Secondary | ICD-10-CM | POA: Insufficient documentation

## 2018-04-20 DIAGNOSIS — J441 Chronic obstructive pulmonary disease with (acute) exacerbation: Secondary | ICD-10-CM

## 2018-04-20 DIAGNOSIS — R059 Cough, unspecified: Secondary | ICD-10-CM

## 2018-04-20 HISTORY — DX: Unspecified asthma, uncomplicated: J45.909

## 2018-04-20 MED ORDER — BENZONATATE 100 MG PO CAPS: 100.0000 mg | ORAL_CAPSULE | Freq: Three times a day (TID) | ORAL | 0 refills | Status: DC

## 2018-04-20 MED ORDER — AMOXICILLIN 500 MG PO CAPS: 500.0000 mg | ORAL_CAPSULE | Freq: Three times a day (TID) | ORAL | 0 refills | Status: AC

## 2018-04-20 MED ORDER — HYDROCODONE-HOMATROPINE 5-1.5 MG/5ML PO SYRP: 5.0000 mL | ORAL_SOLUTION | Freq: Four times a day (QID) | ORAL | 0 refills | Status: DC | PRN

## 2018-04-20 MED ORDER — DOXYCYCLINE HYCLATE 100 MG PO CAPS: 100.0000 mg | ORAL_CAPSULE | Freq: Two times a day (BID) | ORAL | 0 refills | Status: DC

## 2018-04-20 NOTE — Discharge Instructions (Signed)
-  Take medications as directed. -OTC medications for symptomatic relief, tylenol or advil for any fever at home. -Continue to use inhalers at home. -Follow-up if no improvement in symptoms.

## 2018-04-20 NOTE — ED Triage Notes (Signed)
Patient states she has been sick since Christmas with cough/congestion and weak.

## 2018-04-20 NOTE — ED Provider Notes (Signed)
[facility]    CSN: [id number] Arrival date & time: 04/20/18  2197     History   Chief Complaint Chief Complaint  Patient presents with  . Cough    HPI Veronica Kennedy is a 63 y.o. female who presents today for a history of cough and congestion ongoing since Christmas.  The patient denies any high fevers at home, does report mild low-grade fevers.  The patient does have a history of COPD, she uses a albuterol inhaler in addition to Spiriva, she has been using these medications at home but denies any improvement of symptoms.  She feels that she is weak, she does not use home oxygen.  She reports quitting smoking 10 years ago.  She denies any facial pain, no hearing or visual changes.  She does feel that she is experiencing postnasal drainage.  No bloody sputum when coughing, she is producing green mucus with cough.  No night sweats or recent unintentional weight loss.  HPI  Past Medical History:  Diagnosis Date  . Asthma   . COPD (chronic obstructive pulmonary disease) ([place])   . GERD (gastroesophageal reflux disease)   . Hypercholesterolemia   . Hypertension   . Melanoma ([place])    approximately 8 years ago resected from Right lower leg.     Patient Active Problem List   Diagnosis Date Noted  . GERD (gastroesophageal reflux disease) 09/21/2012  . Stress 09/21/2012  . Essential hypertension, benign 09/19/2012  . Hypercholesterolemia 09/19/2012    Past Surgical History:  Procedure Laterality Date  . CHOLECYSTECTOMY    . TONSILLECTOMY    . TUBAL LIGATION      OB History   No obstetric history on file.      Home Medications    Prior to Admission medications   Medication Sig Start Date End Date Taking? Authorizing Provider  albuterol (PROVENTIL HFA;VENTOLIN HFA) 108 (90 BASE) MCG/ACT inhaler Inhale 2 puffs into the lungs every 6 (six) hours as needed for wheezing.   Yes [provider]  citalopram (CELEXA) 40 MG tablet Take 1.5 tablets (60 mg  total) by mouth daily. 09/21/12  Yes Einar Pheasant, MD  cyclobenzaprine (FLEXERIL) 10 MG tablet Take 1 tablet (10 mg total) by mouth 3 (three) times daily as needed. 03/06/12  Yes Einar Pheasant, MD  esomeprazole (NEXIUM) 40 MG capsule Take 1 capsule (40 mg total) by mouth daily. 12/02/12  Yes Einar Pheasant, MD  hydrochlorothiazide (HYDRODIURIL) 25 MG tablet Take 1 tablet (25 mg total) by mouth daily. 09/30/12  Yes Einar Pheasant, MD  ramipril (ALTACE) 2.5 MG capsule Take 1 capsule (2.5 mg total) by mouth daily. 11/08/12  Yes Einar Pheasant, MD  SPIRIVA HANDIHALER 18 MCG inhalation capsule INHALE THE CONTENTS OF 1 CAPSULE DAILY 12/21/12  Yes Einar Pheasant, MD  traZODone (DESYREL) 150 MG tablet TAKE 1 TABLET (150 MG TOTAL) AT BEDTIME 01/07/13  Yes Einar Pheasant, MD  amoxicillin (AMOXIL) 500 MG capsule Take 1 capsule (500 mg total) by mouth 3 (three) times daily for 7 days. 04/20/18 04/27/18  Lattie Corns, PA-C  atorvastatin (LIPITOR) 10 MG tablet Take 1 tablet (10 mg total) by mouth daily. 11/08/12   Einar Pheasant, MD  benzonatate (TESSALON) 100 MG capsule Take 1 capsule (100 mg total) by mouth every 8 (eight) hours. 04/20/18   Lattie Corns, PA-C  chlorhexidine (HIBICLENS) 4 % external liquid Apply topically daily as needed. Dilute 10-15 mL in water, Use daily when bathing for 1-2 weeks 06/08/17  Melynda Ripple, MD  doxycycline (VIBRAMYCIN) 100 MG capsule Take 1 capsule (100 mg total) by mouth 2 (two) times daily. 04/20/18   Lattie Corns, PA-C  fluticasone (FLONASE) 50 MCG/ACT nasal spray Place 2 sprays into both nostrils daily. 07/15/15   Frederich Cha, MD  HYDROcodone-homatropine [facility]) 5-1.5 MG/5ML syrup Take 5 mLs by mouth every 6 (six) hours as needed for cough. 04/20/18   Lattie Corns, PA-C    Family History Family History  Problem Relation Age of Onset  . Breast cancer Maternal Aunt 73  . Dementia Mother   . Heart disease Mother   . Diabetes Mother   . Heart  attack Father   . AAA (abdominal aortic aneurysm) Father   . Hypertension Father   . Hyperlipidemia Father     Social History Social History   Tobacco Use  . Smoking status: Former Smoker    Last attempt to quit: 06/08/2005    Years since quitting: 12.8  . Smokeless tobacco: Never Used  Substance Use Topics  . Alcohol use: No  . Drug use: No     Allergies   Patient has no known allergies.   Review of Systems Review of Systems  Constitutional: Positive for fever.  HENT: Positive for sore throat.   Eyes: Negative.   Respiratory: Positive for cough and wheezing.   Cardiovascular: Negative for chest pain.  Gastrointestinal: Negative.   All other systems reviewed and are negative.  Physical Exam Triage Vital Signs ED Triage Vitals  Enc Vitals Group     BP 04/20/18 0907 (!) 124/98     Pulse Rate 04/20/18 0907 79     Resp 04/20/18 0907 18     Temp 04/20/18 0907 98.1 F (36.7 C)     Temp Source 04/20/18 0907 Oral     SpO2 04/20/18 0907 95 %     Weight 04/20/18 0903 174 lb (78.9 kg)     Height 04/20/18 0903 5\' 4"  (1.626 m)     Head Circumference --      Peak Flow --      Pain Score 04/20/18 0903 0     Pain Loc --      Pain Edu? --      Excl. in [place]? --    No data found.  Updated Vital Signs BP (!) 124/98   Pulse 79   Temp 98.1 F (36.7 C) (Oral)   Resp 18   Ht 5\' 4"  (1.626 m)   Wt 174 lb (78.9 kg)   SpO2 95%   BMI 29.87 kg/m   Visual Acuity Right Eye Distance:   Left Eye Distance:   Bilateral Distance:    Right Eye Near:   Left Eye Near:    Bilateral Near:     Physical Exam Vitals signs and nursing note reviewed.  Constitutional:      General: She is not in acute distress.    Appearance: She is not ill-appearing or toxic-appearing.  HENT:     Right Ear: Tympanic membrane and ear canal normal. There is no impacted cerumen.     Left Ear: Tympanic membrane and ear canal normal. There is no impacted cerumen.     Nose:     Comments: Mild erythema  to bilateral nasal canals.    Mouth/Throat:     Pharynx: No oropharyngeal exudate.     Comments: Erythema to posterior oropharynx without exudate. Eyes:     Extraocular Movements: Extraocular movements intact.     Conjunctiva/sclera: Conjunctivae  normal.     Pupils: Pupils are equal, round, and reactive to light.  Cardiovascular:     Rate and Rhythm: Normal rate and regular rhythm.     Heart sounds: No murmur. No friction rub. No gallop.   Pulmonary:     Effort: No respiratory distress.     Breath sounds: No stridor. Wheezing present. No rhonchi.     Comments: Moderate Wheezes to all lung fields. Lymphadenopathy:     Cervical: No cervical adenopathy.  Neurological:     Mental Status: She is alert.    UC Treatments / Results  Labs (all labs ordered are listed, but only abnormal results are displayed) Labs Reviewed - No data to display  EKG None  Radiology Dg Chest 2 View  Result Date: 04/20/2018 CLINICAL DATA:  Cough and congestion 1 week. EXAM: CHEST - 2 VIEW COMPARISON:  05/04/2015 FINDINGS: Lungs are adequately inflated and otherwise clear. Cardiomediastinal silhouette and remainder of the exam is unchanged. IMPRESSION: No active cardiopulmonary disease. Electronically Signed   By: Marin Olp M.D.   On: 04/20/2018 09:43    Procedures Procedures (including critical care time)  Medications Ordered in UC Medications - No data to display  Initial Impression / Assessment and Plan / UC Course  I have reviewed the triage vital signs and the nursing notes.  Pertinent labs & imaging results that were available during my care of the patient were reviewed by me and considered in my medical decision making (see chart for details).     1.  Options were discussed today with the patient. 2.  X-rays are negative for pneumonia however moderate wheeze throughout lung fields with auscultation and patient has a history of COPD. 3.  Patient was prescribed Tessalon in addition to  cough syrup for nighttime cough symptoms. 4.  We will treat with antibiotics per up-to-date recommendations.  Will double cover with amoxicillin in addition to doxycycline. 5.  We will follow-up if continued symptoms or if symptoms worsen.  Encouraged her continue to use her home inhalers. Final Clinical Impressions(s) / UC Diagnoses   Final diagnoses:  Cough  Acute upper respiratory infection  COPD exacerbation ([place])     Discharge Instructions     -Take medications as directed. -OTC medications for symptomatic relief, tylenol or advil for any fever at home. -Continue to use inhalers at home. -Follow-up if no improvement in symptoms.   ED Prescriptions    Medication Sig Dispense Auth. Provider   benzonatate (TESSALON) 100 MG capsule Take 1 capsule (100 mg total) by mouth every 8 (eight) hours. 21 capsule Damaris Hippo Lance, PA-C   HYDROcodone-homatropine [facility]) 5-1.5 MG/5ML syrup Take 5 mLs by mouth every 6 (six) hours as needed for cough. 120 mL Lattie Corns, PA-C   amoxicillin (AMOXIL) 500 MG capsule Take 1 capsule (500 mg total) by mouth 3 (three) times daily for 7 days. 21 capsule Lattie Corns, PA-C   doxycycline (VIBRAMYCIN) 100 MG capsule Take 1 capsule (100 mg total) by mouth 2 (two) times daily. 14 capsule Lattie Corns, PA-C     Controlled Substance Prescriptions [place] Controlled Substance Registry consulted? Not Applicable   Lattie Corns, PA-C 04/20/18 1009

## 2018-10-28 ENCOUNTER — Ambulatory Visit
Admission: EM | Admit: 2018-10-28 | Discharge: 2018-10-28 | Disposition: A | Discharge status: Home / Self Care | Payer: BC Managed Care – PPO | Attending: Family Medicine | Admitting: Family Medicine

## 2018-10-28 ENCOUNTER — Other Ambulatory Visit: Payer: Self-pay

## 2018-10-28 DIAGNOSIS — Y93B9 Activity, other involving muscle strengthening exercises: Secondary | ICD-10-CM

## 2018-10-28 DIAGNOSIS — M5431 Sciatica, right side: Secondary | ICD-10-CM | POA: Diagnosis not present

## 2018-10-28 DIAGNOSIS — S76311A Strain of muscle, fascia and tendon of the posterior muscle group at thigh level, right thigh, initial encounter: Secondary | ICD-10-CM

## 2018-10-28 MED ORDER — PREDNISONE 10 MG PO TABS: ORAL_TABLET | ORAL | 0 refills | Status: DC

## 2018-10-28 MED ORDER — CYCLOBENZAPRINE HCL 10 MG PO TABS: 10.0000 mg | ORAL_TABLET | Freq: Every evening | ORAL | 0 refills | Status: DC | PRN

## 2018-10-28 NOTE — ED Triage Notes (Signed)
Patient complains of right leg/buttock pain that radiates down her leg x 3-4 weeks.

## 2018-10-28 NOTE — Discharge Instructions (Signed)
Take medication as prescribed. Rest. Drink plenty of fluids. Ice.Heat.  Follow up with your primary care physician this week as needed for continued pain. Return to Urgent care for new or worsening concerns.

## 2018-10-28 NOTE — ED Provider Notes (Signed)
[facility] ____________________________________________  Time seen: Approximately 10:12 AM  I have reviewed the triage vital signs and the nursing notes.   HISTORY  Chief Complaint Leg Pain   HPI Veronica Kennedy is a 63 y.o. female presenting for evaluation of right buttocks pain that intermittently radiates down right thigh present for last 2 to 3 weeks.  Patient states that she has recently started walking more as well as doing stair climbing, and states she noticed this after starting the increase activity.  Does report she has had a history of some low back pain with degenerative disc, no surgeries and no fractures.  Patient denies any low back pain.  Denies any fall or direct injury or direct trauma.  Denies any current pain radiation.  Denies any numbness or paresthesias.  Continues to have full range of motion present.  Did try some arthritis Tylenol which helped the pain some.  Has done exercises at home which did not help much.  Denies other aggravating or alleviating factors.  Denies urinary or bowel retention or incontinence.  Continues remain active.  Denies recent cough, congestion, chest pain, shortness of breath or fevers.  Hortencia Pilar, MD : PCP   Past Medical History:  Diagnosis Date  . Asthma   . COPD (chronic obstructive pulmonary disease) ([place])   . GERD (gastroesophageal reflux disease)   . Hypercholesterolemia   . Hypertension   . Melanoma ([place])    approximately 8 years ago resected from Right lower leg.     Patient Active Problem List   Diagnosis Date Noted  . GERD (gastroesophageal reflux disease) 09/21/2012  . Stress 09/21/2012  . Essential hypertension, benign 09/19/2012  . Hypercholesterolemia 09/19/2012    Past Surgical History:  Procedure Laterality Date  . CHOLECYSTECTOMY    . TONSILLECTOMY    . TUBAL LIGATION       No current facility-administered medications for this encounter.   Current Outpatient Medications:  .   albuterol (PROVENTIL HFA;VENTOLIN HFA) 108 (90 BASE) MCG/ACT inhaler, Inhale 2 puffs into the lungs every 6 (six) hours as needed for wheezing., Disp: , Rfl:  .  atorvastatin (LIPITOR) 10 MG tablet, Take 1 tablet (10 mg total) by mouth daily., Disp: 90 tablet, Rfl: 1 .  citalopram (CELEXA) 40 MG tablet, Take 1.5 tablets (60 mg total) by mouth daily., Disp: 135 tablet, Rfl: 1 .  Fluticasone-Salmeterol (ADVAIR) 250-50 MCG/DOSE AEPB, Inhale into the lungs., Disp: , Rfl:  .  hydrochlorothiazide (HYDRODIURIL) 25 MG tablet, Take 1 tablet (25 mg total) by mouth daily., Disp: 90 tablet, Rfl: 1 .  isosorbide mononitrate (IMDUR) 30 MG 24 hr tablet, isosorbide mononitrate ER 30 mg tablet,extended release 24 hr, Disp: , Rfl:  .  pantoprazole (PROTONIX) 40 MG tablet, TAKE 1 TABLET TWICE A DAY, Disp: , Rfl:  .  ramipril (ALTACE) 2.5 MG capsule, Take 1 capsule (2.5 mg total) by mouth daily., Disp: 90 capsule, Rfl: 1 .  SPIRIVA HANDIHALER 18 MCG inhalation capsule, INHALE THE CONTENTS OF 1 CAPSULE DAILY, Disp: 1 capsule, Rfl: 5 .  traZODone (DESYREL) 150 MG tablet, TAKE 1 TABLET (150 MG TOTAL) AT BEDTIME, Disp: 90 tablet, Rfl: 0 .  cyclobenzaprine (FLEXERIL) 10 MG tablet, Take 1 tablet (10 mg total) by mouth at bedtime as needed for muscle spasms. Do not drive while taking as can cause drowsiness, Disp: 15 tablet, Rfl: 0 .  predniSONE (DELTASONE) 10 MG tablet, Start 60 mg po day one, then 50 mg po day two, taper by  10 mg daily until complete., Disp: 21 tablet, Rfl: 0  Allergies Patient has no known allergies.  Family History  Problem Relation Age of Onset  . Breast cancer Maternal Aunt 51  . Dementia Mother   . Heart disease Mother   . Diabetes Mother   . Heart attack Father   . AAA (abdominal aortic aneurysm) Father   . Hypertension Father   . Hyperlipidemia Father     Social History Social History   Tobacco Use  . Smoking status: Former Smoker    Quit date: 06/08/2005    Years since quitting: 13.3   . Smokeless tobacco: Never Used  Substance Use Topics  . Alcohol use: No  . Drug use: No    Review of Systems Constitutional: No fever ENT: No sore throat. Cardiovascular: Denies chest pain. Respiratory: Denies shortness of breath. Gastrointestinal: No abdominal pain.  Genitourinary: Negative for dysuria. Musculoskeletal: Positive right buttocks pain. Skin: Negative for rash. Neurological: Negative for focal weakness or numbness.   ____________________________________________   PHYSICAL EXAM:  VITAL SIGNS: ED Triage Vitals  Enc Vitals Group     BP 10/28/18 0951 124/62     Pulse Rate 10/28/18 0951 94     Resp 10/28/18 0951 16     Temp 10/28/18 0951 98.1 F (36.7 C)     Temp Source 10/28/18 0951 Oral     SpO2 10/28/18 0951 97 %     Weight 10/28/18 0947 180 lb (81.6 kg)     Height 10/28/18 0947 5' 4.5" (1.638 m)     Head Circumference --      Peak Flow --      Pain Score 10/28/18 0947 8     Pain Loc --      Pain Edu? --      Excl. in [place]? --     Constitutional: Alert and oriented. Well appearing and in no acute distress. Eyes: Conjunctivae are normal. ENT      Head: Normocephalic and atraumatic. Cardiovascular: Normal rate, regular rhythm. Grossly normal heart sounds.  Good peripheral circulation. Respiratory: Normal respiratory effort without tachypnea nor retractions. Breath sounds are clear and equal bilaterally. No wheezes, rales, rhonchi. Gastrointestinal: Soft and nontender. Musculoskeletal:  No midline cervical, thoracic or lumbar tenderness to palpation. Bilateral pedal pulses equal and easily palpated.  Steady gait. Except: Right upper buttocks to mid buttocks along sciatic notch and piriformis muscle tenderness to direct palpation, no rash, no point bony tenderness, hip nontender, changes positions quickly in room, minimal discomfort with standing right knee lift, no pain with right hip abduction or abduction, right lower extremity otherwise nontender.  No  edema noted to bilateral lower extremities. Neurologic:  Normal speech and language. No gross focal neurologic deficits are appreciated. Speech is normal. No gait instability.  Skin:  Skin is warm, dry and intact. No rash noted. Psychiatric: Mood and affect are normal. Speech and behavior are normal. Patient exhibits appropriate insight and judgment   ___________________________________________   LABS (all labs ordered are listed, but only abnormal results are displayed)  Labs Reviewed - No data to display ____________________________________________   PROCEDURES Procedures    INITIAL IMPRESSION / ASSESSMENT AND PLAN / ED COURSE  Pertinent labs & imaging results that were available during my care of the patient were reviewed by me and considered in my medical decision making (see chart for details).  Well-appearing patient.  No acute distress.  No fall. Suspect piriformis strain with secondary sciatica.  Will treat with prednisone and  Flexeril.  Discussed use of ice and heat, stretching and avoidance of strenuous activity. Discussed indication, risks and benefits of medications with patient.  Discussed follow up with Primary care physician this week. Discussed follow up and return parameters including no resolution or any worsening concerns. Patient verbalized understanding and agreed to plan.   ____________________________________________   FINAL CLINICAL IMPRESSION(S) / ED DIAGNOSES  Final diagnoses:  Right sided sciatica  Strain of right piriformis muscle, initial encounter     ED Discharge Orders         Ordered    predniSONE (DELTASONE) 10 MG tablet     10/28/18 1006    cyclobenzaprine (FLEXERIL) 10 MG tablet  At bedtime PRN     10/28/18 1006           Note: This dictation was prepared with Dragon dictation along with smaller phrase technology. Any transcriptional errors that result from this process are unintentional.         Marylene Land, NP 10/28/18  1016

## 2018-11-16 ENCOUNTER — Encounter: Payer: Self-pay | Admitting: Emergency Medicine

## 2018-11-16 ENCOUNTER — Other Ambulatory Visit: Payer: Self-pay

## 2018-11-16 ENCOUNTER — Ambulatory Visit (INDEPENDENT_AMBULATORY_CARE_PROVIDER_SITE_OTHER): Payer: BC Managed Care – PPO

## 2018-11-16 ENCOUNTER — Ambulatory Visit
Admission: EM | Admit: 2018-11-16 | Discharge: 2018-11-16 | Disposition: A | Discharge status: Home / Self Care | Payer: BC Managed Care – PPO | Attending: Urgent Care | Admitting: Urgent Care

## 2018-11-16 DIAGNOSIS — M25461 Effusion, right knee: Secondary | ICD-10-CM | POA: Diagnosis not present

## 2018-11-16 DIAGNOSIS — S83101A Unspecified subluxation of right knee, initial encounter: Secondary | ICD-10-CM | POA: Diagnosis not present

## 2018-11-16 DIAGNOSIS — W108XXA Fall (on) (from) other stairs and steps, initial encounter: Secondary | ICD-10-CM

## 2018-11-16 DIAGNOSIS — M25561 Pain in right knee: Secondary | ICD-10-CM | POA: Diagnosis not present

## 2018-11-16 MED ORDER — HYDROCODONE-ACETAMINOPHEN 5-325 MG PO TABS: 1.0000 | ORAL_TABLET | Freq: Three times a day (TID) | ORAL | 0 refills | Status: DC | PRN

## 2018-11-16 NOTE — ED Provider Notes (Addendum)
Veronica Kennedy, Veronica Kennedy   Name: Veronica Kennedy DOB: 24-Jun-1955 MRN: [id number] CSN: [id number] PCP: Hortencia Pilar, MD  Arrival date and time:  11/16/18 1357  Chief Complaint:  Fall   NOTE: Prior to seeing the patient today, I have reviewed the triage nursing documentation and vital signs. Clinical staff has updated patient's PMH/PSHx, current medication list, and drug allergies/intolerances to ensure comprehensive history available to assist in medical decision making.   History:   HPI: Veronica Kennedy is a 63 y.o. female who presents today with complaints of pain in her RIGHT knee following a fall that occurred yesterday (11/15/2018). Patient describes that the injury occurred when she was on a set of stairs. She lost her footing and fell. She notes that she landed directly in her knee. She presents today with pain in her mid-thigh that extends distally to her mid-tib/fib area. Patient is able to bear weight and take a few steps, however she notes that the pain is too intense for her to take more than a few steps. She has not appreciated any numbness/tingling, discoloration, or altered temperature in her RIGHT lower extremity. She denies experiencing pain in any other part of her body. She has a minor abrasions to her LEFT elbow. In efforts to conservatively manage her symptoms at home, the patient notes that she has used APAP, which has not helped to improve her pain.    Past Medical History:  Diagnosis Date  . Asthma   . COPD (chronic obstructive pulmonary disease) ([place])   . GERD (gastroesophageal reflux disease)   . Hypercholesterolemia   . Hypertension   . Melanoma ([place])    approximately 8 years ago resected from Right lower leg.     Past Surgical History:  Procedure Laterality Date  . CHOLECYSTECTOMY    . TONSILLECTOMY    . TUBAL LIGATION      Family History  Problem Relation Age of Onset  . Breast cancer Maternal Aunt 35  . Dementia Mother   . Heart disease Mother   . Diabetes  Mother   . Heart attack Father   . AAA (abdominal aortic aneurysm) Father   . Hypertension Father   . Hyperlipidemia Father     Social History   Tobacco Use  . Smoking status: Former Smoker    Quit date: 06/08/2005    Years since quitting: 13.4  . Smokeless tobacco: Never Used  Substance Use Topics  . Alcohol use: No  . Drug use: No    Patient Active Problem List   Diagnosis Date Noted  . GERD (gastroesophageal reflux disease) 09/21/2012  . Stress 09/21/2012  . Essential hypertension, benign 09/19/2012  . Hypercholesterolemia 09/19/2012    Home Medications:    Current Meds  Medication Sig  . albuterol (PROVENTIL HFA;VENTOLIN HFA) 108 (90 BASE) MCG/ACT inhaler Inhale 2 puffs into the lungs every 6 (six) hours as needed for wheezing.  Marland Kitchen atorvastatin (LIPITOR) 10 MG tablet Take 1 tablet (10 mg total) by mouth daily.  . citalopram (CELEXA) 40 MG tablet Take 1.5 tablets (60 mg total) by mouth daily.  . cyclobenzaprine (FLEXERIL) 10 MG tablet Take 1 tablet (10 mg total) by mouth at bedtime as needed for muscle spasms. Do not drive while taking as can cause drowsiness  . Fluticasone-Salmeterol (ADVAIR) 250-50 MCG/DOSE AEPB Inhale into the lungs.  . hydrochlorothiazide (HYDRODIURIL) 25 MG tablet Take 1 tablet (25 mg total) by mouth daily.  . isosorbide mononitrate (IMDUR) 30 MG 24 hr tablet isosorbide mononitrate ER  30 mg tablet,extended release 24 hr  . pantoprazole (PROTONIX) 40 MG tablet TAKE 1 TABLET TWICE A DAY  . ramipril (ALTACE) 2.5 MG capsule Take 1 capsule (2.5 mg total) by mouth daily.  Marland Kitchen SPIRIVA HANDIHALER 18 MCG inhalation capsule INHALE THE CONTENTS OF 1 CAPSULE DAILY  . traZODone (DESYREL) 150 MG tablet TAKE 1 TABLET (150 MG TOTAL) AT BEDTIME    Allergies:   Patient has no known allergies.  Review of Systems (ROS): Review of Systems  Constitutional: Negative for chills and fever.  Respiratory: Negative for cough and shortness of breath.   Cardiovascular:  Negative for chest pain and palpitations.  Musculoskeletal:       Acute pain in RLE s/p fall  Skin: Positive for wound (LEFT elbow).  Neurological: Positive for weakness (RLE 2/2 acute pain). Negative for dizziness, syncope, numbness and headaches.  All other systems reviewed and are negative.    Vital Signs: Today's Vitals   11/16/18 1425 11/16/18 1428 11/16/18 1652  BP:  111/86   Pulse:  89   Resp:  18   Temp:  98.4 F (36.9 C)   TempSrc:  Oral   SpO2:  97%   Weight: 188 lb (85.3 kg)    Height: 5' 4.5" (1.638 m)    PainSc: 10-Worst pain ever  10-Worst pain ever    Physical Exam: Physical Exam  Constitutional: She is oriented to person, place, and time and well-developed, well-nourished, and in no distress.  HENT:  Head: Normocephalic and atraumatic.  Mouth/Throat: Mucous membranes are normal.  Eyes: Pupils are equal, round, and reactive to light. EOM are normal.  Neck: Normal range of motion and full passive range of motion without pain. Neck supple. No spinous process tenderness and no muscular tenderness present. No tracheal deviation present.  Cardiovascular: Normal rate, regular rhythm, normal heart sounds and intact distal pulses. Exam reveals no gallop and no friction rub.  No murmur heard. Pulmonary/Chest: Effort normal and breath sounds normal. No respiratory distress. She has no wheezes. She has no rales.  Musculoskeletal:     Right hip: Normal.     Right knee: She exhibits decreased range of motion, swelling and effusion. She exhibits no ecchymosis. Tenderness found. LCL and patellar tendon tenderness noted.     Right ankle: Normal.     Right upper leg: She exhibits tenderness (terminates at level of mid-thigh). She exhibits no swelling and no deformity.     Right lower leg: She exhibits tenderness (terminates at level of mid-tib/fib). She exhibits no swelling and no deformity.     Comments: Able to passively flex and extend RIGHT knee without reports of increased  pain. No joint laxity. Pain mainly associated with weight bearing, even with simple touch down pivot. Pain with opposed plantarflexion of foot. Able to take a few steps. Entire extremity warm and dry. (+) PMS noted distal to injury; DP/PT pulses equal/strong/intact when compared contralaterally; capillary refill WNL.   Lymphadenopathy:    She has no cervical adenopathy.  Neurological: She is alert and oriented to person, place, and time. She has normal sensation. She displays weakness (RLE 2/2 acute pain/injury). Gait abnormal. GCS score is 15.  Reflex Scores:      Patellar reflexes are 4+ on the right side. Skin: Skin is warm and dry. No rash noted.  Psychiatric: Mood, memory, affect and judgment normal.  Nursing note and vitals reviewed.   Urgent Care Treatments / Results:   LABS: PLEASE NOTE: all labs that were ordered this  encounter are listed, however only abnormal results are displayed. Labs Reviewed - No data to display  EKG: -None  RADIOLOGY: Dg Tibia/fibula Right  Result Date: 11/16/2018 CLINICAL DATA:  Right lateral knee pain. EXAM: RIGHT TIBIA AND FIBULA - 2 VIEW COMPARISON:  None. FINDINGS: There is no evidence of fracture or other focal bone lesions. Soft tissues are unremarkable. Degenerative type changes are identified within the right knee. IMPRESSION: 1. No acute findings. 2. Right knee osteoarthritis. . Electronically Signed   By: Kerby Moors M.D.   On: 11/16/2018 15:45   Dg Knee Complete 4 Views Right  Result Date: 11/16/2018 CLINICAL DATA:  Pain following fall EXAM: RIGHT KNEE - COMPLETE 4+ VIEW COMPARISON:  None. FINDINGS: Frontal, lateral, and bilateral oblique views were obtained. There is no fracture or dislocation. There is lateral patellar subluxation. There is a focal joint effusion. There is moderate narrowing of the patellofemoral joint. There is mild spurring in all compartments. No erosive changes. There is a spur along the anterior superior patella.  IMPRESSION: Lateral patellar subluxation. No fracture or frank dislocation. There is a knee joint effusion. There is moderate narrowing of the patellofemoral joint. There is spurring in all compartments. A spur along the anterior superior patella may represent distal quadriceps tendinosis. Electronically Signed   By: Lowella Grip III M.D.   On: 11/16/2018 15:40    PROCEDURES: Procedures  MEDICATIONS RECEIVED THIS VISIT: Medications - No data to display  PERTINENT CLINICAL COURSE NOTES/UPDATES: Clinical Course as of Nov 17 2127  Sat Nov 16, 2018  1605 Radiographs reviewed with Dr. Craig Guess (orthopedics). Dicussed presenting complaints and MOI. Subluxation/fracture felt to be less likely given age and review of plain films. Favoring quadriceps tendon rupture. Recommendations received to immobilize and have patient follow up with [facility] ortho next week.    [BG]    Clinical Course User Index [BG] Karen Kitchens, NP   Initial Impression / Assessment and Plan / Urgent Care Course:  Pertinent labs & imaging results that were available during my care of the patient were personally reviewed by me and considered in my medical decision making (see lab/imaging section of note for values and interpretations).  Veronica Kennedy is a 63 y.o. female who [facility] to [facility] today with complaints of Fall   Patient is well appearing overall in clinic today. She does not appear to be in any acute distress. Presenting symptoms (see HPI) and exam as documented above. Diagnostic radiograph of the RIGHT tib/fib negative for fracture. Films of the RIGHT knee reveal lateral patellar subluxation with no evidence of fracture or dislocation. Reviewed films with orthopedic provider (see clinical notes). Subluxation less likely per orthopedist based on age, MOI, and films. Patient with PROM without intense pain. Pain is mainly elicited by opposed plantarflexion of the foot and weight bearing. She is able to  bear weight in a toe touch capacity for transfers from chair to stretcher. Able to take a few steps before having to sit down due to pain. Orthopedist favoring quadriceps tendon rupture. Will place patient in a full leg knee immobilizer. She was encouraged to avoid weight bearing at this time. She already has crutches and was observed using them properly today in clinic. Patient to continue IBU and/or APAP for minor pain. She was encouraged to ice and elevate her knee as complimentary approaches to help with pain and swelling. Will provide a short course of Norco 5/325 mg for severe pain. Educated to 3 gram APAP  limit from all sources in a 24 hour period; verbalized understanding.   Patient needs to be seen for further evaluation by orthopedics. Name and office contact information provided on today's AVS for Dr. Hessie Knows. Patient advised the she will need to contact the office to schedule an appointment to be seen in clinic for ongoing management.   I have reviewed the follow up and strict return precautions for any new or worsening symptoms. Patient is aware of symptoms that would be deemed urgent/emergent, and would thus require further evaluation either here or in the emergency department. At the time of discharge, she verbalized understanding and consent with the discharge plan as it was reviewed with her. All questions were fielded by provider and/or clinic staff prior to patient discharge.    Final Clinical Impressions / Urgent Care Diagnoses:   Final diagnoses:  Knee subluxation, right, initial encounter  Pain and swelling of knee, right  Fall (on) (from) other stairs and steps, initial encounter    New Prescriptions:  [place] Controlled Substance Registry consulted? Yes, I have consulted the [place] Controlled Substances Registry for this patient, and feel the risk/benefit ratio today is favorable for proceeding with this prescription for a controlled substance.  . Discussed use of controlled  substance medication to treat her acute pain.  o Reviewed  STOP Act regulations  o Clinic does not refill controlled substances over the phone without face to face evaluation.  . Safety precautions reviewed.  o Medications should not be bitten, chewed, crushed, shared, or taken with alcohol.  o Avoid use while working, driving, or operating heavy machinery.  o Side effects associated with the use of this particular medication reviewed. - Patient understands that this medication can cause CNS depression, increase her risk of falls, and even lead to overdose that may result in death, if used outside of the parameters that she and I discussed.  With all of this in mind, she knowingly accepts the risks and responsibilities associated with intended course of treatment, and elects to responsibly proceed as discussed.  Meds ordered this encounter  Medications  . HYDROcodone-acetaminophen (NORCO) 5-325 MG tablet    Sig: Take 1 tablet by mouth 3 (three) times daily as needed for moderate pain.    Dispense:  15 tablet    Refill:  0    Recommended Follow up Care:  Patient encouraged to follow up with the following provider within the specified time frame, or sooner as dictated by the severity of her symptoms. As always, she was instructed that for any urgent/emergent care needs, she should seek care either here or in the emergency department for more immediate evaluation.  Follow-up Information    Call  Hessie Knows, MD.   Specialty: Orthopedic Surgery Why: Call on Monday - need to be seen. Contact information: [place] [id number] [phone]         NOTE: This note was prepared using Dragon dictation software along with smaller phrase technology. Despite my best ability to proofread, there is the potential that transcriptional errors may still occur from this process, and are completely unintentional.     Karen Kitchens, NP 11/17/18  2129

## 2018-11-16 NOTE — ED Triage Notes (Signed)
Pt fell down the stairs last night and c/o right knee pain. Paint also radiates halfway up her thigh and half way down her lower leg. She states that she can not walk on this leg.

## 2018-11-16 NOTE — Discharge Instructions (Signed)
It was very nice seeing you today in clinic. Thank you for entrusting me with your care.   Please utilize the medications that we discussed. Your prescriptions have been called in to your pharmacy. Wear knee brace and use crutches. Avoid weight bearing. Elevate and apply ice. Call orthopedics Monday morning to be seen for further evaluation. I have provided you the name and office contact information for an excellent local provider.   If your symptoms/condition worsens, please seek follow up care either here or in the ER. Please remember, our [place] providers are "right here with you" when you need [place].   Again, it was my pleasure to take care of you today. Thank you for choosing our clinic. I hope that you start to feel better quickly.   Honor Loh, MSN, APRN, FNP-C, CEN Advanced Practice Provider [facility]

## 2019-01-20 ENCOUNTER — Other Ambulatory Visit: Payer: Self-pay | Admitting: Family Medicine

## 2019-01-20 DIAGNOSIS — Z1231 Encounter for screening mammogram for malignant neoplasm of breast: Secondary | ICD-10-CM

## 2019-02-08 ENCOUNTER — Ambulatory Visit
Admission: EM | Admit: 2019-02-08 | Discharge: 2019-02-08 | Disposition: A | Discharge status: Home / Self Care | Payer: BC Managed Care – PPO | Attending: Emergency Medicine | Admitting: Emergency Medicine

## 2019-02-08 ENCOUNTER — Encounter: Payer: Self-pay | Admitting: Emergency Medicine

## 2019-02-08 ENCOUNTER — Other Ambulatory Visit: Payer: Self-pay

## 2019-02-08 DIAGNOSIS — S61412A Laceration without foreign body of left hand, initial encounter: Secondary | ICD-10-CM | POA: Diagnosis not present

## 2019-02-08 DIAGNOSIS — Z23 Encounter for immunization: Secondary | ICD-10-CM | POA: Diagnosis not present

## 2019-02-08 DIAGNOSIS — W260XXA Contact with knife, initial encounter: Secondary | ICD-10-CM

## 2019-02-08 DIAGNOSIS — S61412S Laceration without foreign body of left hand, sequela: Secondary | ICD-10-CM

## 2019-02-08 MED ORDER — TETANUS-DIPHTH-ACELL PERTUSSIS 5-2.5-18.5 LF-MCG/0.5 IM SUSP
0.5000 mL | Freq: Once | INTRAMUSCULAR | Status: AC
  Administered 2019-02-08: 15:00:00 0.5 mL via INTRAMUSCULAR

## 2019-02-08 NOTE — ED Triage Notes (Signed)
Patient in today after cutting her left hand with a knife at home 2 days ago. Patient's last tetanus (according to care everywhere) was 02/07/13.

## 2019-02-08 NOTE — Discharge Instructions (Addendum)
We weren't able to stitch up your hand since your injru happened over 24 hours ago.   Keep your thumb an immobile as possible to keep area for opening up. Keep a coban wrap around your hand to help keep your thumb immobile.   Do not use any ointments to the area. Keep it clean and dry.   If signs of infection appear (drainage that's not blood, fever, increased pain), follow-up as soon as possible.

## 2019-02-09 NOTE — ED Provider Notes (Signed)
[facility] - [place], [place]   Name: Veronica Kennedy DOB: [birth year] MRN: [id number] CSN: [id number] PCP: Veronica Pilar, MD  Arrival date and time:  02/08/19 1441  Chief Complaint:  Extremity Laceration (left)   NOTE: Prior to seeing the patient today, I have reviewed the triage nursing documentation and vital signs. Clinical staff has updated patient's PMH/PSHx, current medication list, and drug allergies/intolerances to ensure comprehensive history available to assist in medical decision making.   History:   HPI: Veronica Kennedy is a 63 y.o. female who presents today with complaints of laceration to her left hand. The laceration occurred 48 hours ago. She was cooking and lost control of her knife, causing a cut between her thumb and index finger. She has been applying 'liquid band-aid' to the area continuously along with neosporin. She is concerned that the area isn't healing well and could be infected, so she is here requested sutures to the area.    Past Medical History:  Diagnosis Date   Asthma    COPD (chronic obstructive pulmonary disease) ([facility])    GERD (gastroesophageal reflux disease)    Hypercholesterolemia    Hypertension    Melanoma ([place])    approximately 8 years ago resected from Right lower leg.     Past Surgical History:  Procedure Laterality Date   CHOLECYSTECTOMY     TONSILLECTOMY     TUBAL LIGATION      Family History  Problem Relation Age of Onset   Breast cancer Maternal Aunt 38   Dementia Mother    Heart disease Mother    Diabetes Mother    Heart attack Father    AAA (abdominal aortic aneurysm) Father    Hypertension Father    Hyperlipidemia Father     Social History   Tobacco Use   Smoking status: Former Smoker    Quit date: 06/08/2005    Years since quitting: 13.6   Smokeless tobacco: Never Used  Substance Use Topics   Alcohol use: No   Drug use: No    Patient Active Problem List   Diagnosis Date  Noted   GERD (gastroesophageal reflux disease) 09/21/2012   Stress 09/21/2012   Essential hypertension, benign 09/19/2012   Hypercholesterolemia 09/19/2012    Home Medications:    Current Meds  Medication Sig   albuterol (PROVENTIL HFA;VENTOLIN HFA) 108 (90 BASE) MCG/ACT inhaler Inhale 2 puffs into the lungs every 6 (six) hours as needed for wheezing.   atorvastatin (LIPITOR) 10 MG tablet Take 1 tablet (10 mg total) by mouth daily.   citalopram (CELEXA) 40 MG tablet Take 1.5 tablets (60 mg total) by mouth daily.   cyclobenzaprine (FLEXERIL) 10 MG tablet Take 1 tablet (10 mg total) by mouth at bedtime as needed for muscle spasms. Do not drive while taking as can cause drowsiness   Fluticasone-Salmeterol (ADVAIR) 250-50 MCG/DOSE AEPB Inhale into the lungs.   hydrochlorothiazide (HYDRODIURIL) 25 MG tablet Take 1 tablet (25 mg total) by mouth daily.   isosorbide mononitrate (IMDUR) 30 MG 24 hr tablet isosorbide mononitrate ER 30 mg tablet,extended release 24 hr   pantoprazole (PROTONIX) 40 MG tablet TAKE 1 TABLET TWICE A DAY   ramipril (ALTACE) 2.5 MG capsule Take 1 capsule (2.5 mg total) by mouth daily.   SPIRIVA HANDIHALER 18 MCG inhalation capsule INHALE THE CONTENTS OF 1 CAPSULE DAILY   traZODone (DESYREL) 150 MG tablet TAKE 1 TABLET (150 MG TOTAL) AT BEDTIME    Allergies:   Patient has no  known allergies.  Review of Systems (ROS): Review of Systems  Skin: Positive for wound.  All other systems reviewed and are negative.    Vital Signs: Today's Vitals   02/08/19 1449 02/08/19 1450 02/08/19 1513  BP:  (!) 123/103   Pulse:  87   Resp:  18   Temp:  98.1 F (36.7 C)   TempSrc:  Oral   SpO2:  98%   Weight:  190 lb (86.2 kg)   Height:  5' 4.5" (1.638 m)   PainSc: 7   7     Physical Exam: Physical Exam Vitals signs and nursing note reviewed.  Constitutional:      Appearance: Normal appearance.  Musculoskeletal:       Hands:      Urgent Care  Treatments / Results:   LABS: PLEASE NOTE: all labs that were ordered this encounter are listed, however only abnormal results are displayed. Labs Reviewed - No data to display  EKG: -None  RADIOLOGY: No results found.  PROCEDURES: Laceration Repair  Date/Time: 02/09/2019 8:10 AM Performed by: Gertie Baron, NP Authorized by: Gertie Baron, NP   Consent:    Consent obtained:  Verbal   Consent given by:  Patient   Risks discussed:  Poor wound healing and poor cosmetic result Anesthesia (see MAR for exact dosages):    Anesthesia method:  None Laceration details:    Location:  Hand   Hand location:  L palm   Length (cm):  4 Repair type:    Repair type:  Simple Exploration:    Hemostasis achieved with:  Direct pressure   Wound extent: areolar tissue violated     Contaminated: no   Treatment:    Area cleansed with:  Betadine and saline   Amount of cleaning:  Extensive   Irrigation solution:  Sterile saline   Irrigation method:  Syringe Skin repair:    Repair method:  Steri-Strips   Number of Steri-Strips:  4 Approximation:    Approximation:  Loose Post-procedure details:    Dressing:  Bulky dressing   Patient tolerance of procedure:  Tolerated well, no immediate complications Comments:     Coban applied to hand to secure thumb to palmar surface of hand and limit motion of thumb.     MEDICATIONS RECEIVED THIS VISIT: Medications  Tdap (BOOSTRIX) injection 0.5 mL (0.5 mLs Intramuscular Given 02/08/19 1455)    PERTINENT CLINICAL COURSE NOTES/UPDATES:   Initial Impression / Assessment and Plan / Urgent Care Course:  Pertinent labs & imaging results that were available during my care of the patient were personally reviewed by me and considered in my medical decision making (see lab/imaging section of note for values and interpretations).  Veronica Kennedy is a 63 y.o. female who presents to [facility] today with complaints of hand laceration, diagnosed  with the same, and treated as such with the procedure above. NP and patient reviewed discharge instructions below during visit.   Patient is well appearing overall in clinic today. She does not appear to be in any acute distress. Presenting symptoms (see HPI) and exam as documented above.   I have reviewed the follow up and strict return precautions for any new or worsening symptoms. Patient is aware of symptoms that would be deemed urgent/emergent, and would thus require further evaluation either here or in the emergency department. At the time of discharge, she verbalized understanding and consent with the discharge plan as it was reviewed with her. All questions were fielded by  provider and/or clinic staff prior to patient discharge.    Final Clinical Impressions / Urgent Care Diagnoses:   Final diagnoses:  Laceration of left hand without foreign body, sequela    New Prescriptions:  [place] Controlled Substance Registry consulted? Not Applicable  Meds ordered this encounter  Medications   Tdap (BOOSTRIX) injection 0.5 mL      Discharge Instructions     We weren't able to stitch up your hand since your injru happened over 24 hours ago.   Keep your thumb an immobile as possible to keep area for opening up. Keep a coban wrap around your hand to help keep your thumb immobile.   Do not use any ointments to the area. Keep it clean and dry.   If signs of infection appear (drainage that's not blood, fever, increased pain), follow-up as soon as possible.     Recommended Follow up Care:  Patient encouraged to follow up with the following provider within the specified time frame, or sooner as dictated by the severity of her symptoms. As always, she was instructed that for any urgent/emergent care needs, she should seek care either here or in the emergency department for more immediate evaluation.   Gertie Baron, DNP, NP-c    Gertie Baron, NP 02/09/19 [phone]

## 2019-02-17 ENCOUNTER — Ambulatory Visit
Admission: RE | Admit: 2019-02-17 | Discharge: 2019-02-17 | Disposition: A | Discharge status: Home / Self Care | Payer: BC Managed Care – PPO | Source: Ambulatory Visit | Attending: Family Medicine | Admitting: Family Medicine

## 2019-02-17 ENCOUNTER — Ambulatory Visit
Admission: RE | Admit: 2019-02-17 | Discharge: 2019-02-17 | Disposition: A | Discharge status: Home / Self Care | Payer: BC Managed Care – PPO | Attending: Family Medicine | Admitting: Family Medicine

## 2019-02-17 ENCOUNTER — Other Ambulatory Visit: Payer: Self-pay | Admitting: Family Medicine

## 2019-02-17 ENCOUNTER — Other Ambulatory Visit: Payer: Self-pay

## 2019-02-17 DIAGNOSIS — M5431 Sciatica, right side: Secondary | ICD-10-CM | POA: Diagnosis present

## 2019-02-27 ENCOUNTER — Ambulatory Visit
Admission: RE | Admit: 2019-02-27 | Discharge: 2019-02-27 | Disposition: A | Discharge status: Home / Self Care | Payer: BC Managed Care – PPO | Source: Ambulatory Visit | Attending: Family Medicine | Admitting: Family Medicine

## 2019-02-27 DIAGNOSIS — Z1231 Encounter for screening mammogram for malignant neoplasm of breast: Secondary | ICD-10-CM | POA: Insufficient documentation

## 2019-07-13 ENCOUNTER — Ambulatory Visit: Payer: BC Managed Care – PPO | Attending: Internal Medicine

## 2019-07-13 DIAGNOSIS — Z23 Encounter for immunization: Secondary | ICD-10-CM

## 2019-07-18 ENCOUNTER — Ambulatory Visit: Payer: BC Managed Care – PPO

## 2019-08-05 ENCOUNTER — Ambulatory Visit: Payer: BC Managed Care – PPO | Attending: Internal Medicine

## 2019-08-05 DIAGNOSIS — Z23 Encounter for immunization: Secondary | ICD-10-CM

## 2019-08-05 NOTE — Progress Notes (Signed)
   Covid-19 Vaccination Clinic  Name:  DESIRE SOREY    MRN: [id number] DOB: 08-13-1955  08/05/2019  Ms. Pickar was observed post Covid-19 immunization for 15 minutes without incident. She was provided with Vaccine Information Sheet and instruction to access the V-Safe system. Patient left without checking out, this writer called and spoke with patient, verified that she knows S&S of adverse reaction and when to call 911.  Ms. Stubler was instructed to call 911 with any severe reactions post vaccine: Marland Kitchen Difficulty breathing  . Swelling of face and throat  . A fast heartbeat  . A bad rash all over body  . Dizziness and weakness   Immunizations Administered    Name Date Dose VIS Date Route   Pfizer COVID-19 Vaccine 08/05/2019  1:02 PM 0.3 mL 06/11/2018 Intramuscular   Manufacturer: [place]   Lot: [id number]   [place]: [id number]

## 2019-10-21 ENCOUNTER — Other Ambulatory Visit (HOSPITAL_COMMUNITY): Payer: Self-pay | Admitting: Family Medicine

## 2019-10-21 DIAGNOSIS — R55 Syncope and collapse: Secondary | ICD-10-CM

## 2019-10-22 ENCOUNTER — Ambulatory Visit (HOSPITAL_COMMUNITY)
Admission: RE | Admit: 2019-10-22 | Discharge: 2019-10-22 | Disposition: A | Discharge status: Home / Self Care | Payer: Medicare HMO | Source: Ambulatory Visit | Attending: Family Medicine | Admitting: Family Medicine

## 2019-10-22 ENCOUNTER — Other Ambulatory Visit: Payer: Self-pay

## 2019-10-22 DIAGNOSIS — J449 Chronic obstructive pulmonary disease, unspecified: Secondary | ICD-10-CM | POA: Insufficient documentation

## 2019-10-22 DIAGNOSIS — I10 Essential (primary) hypertension: Secondary | ICD-10-CM | POA: Insufficient documentation

## 2019-10-22 DIAGNOSIS — E785 Hyperlipidemia, unspecified: Secondary | ICD-10-CM | POA: Diagnosis not present

## 2019-10-22 DIAGNOSIS — R55 Syncope and collapse: Secondary | ICD-10-CM

## 2019-10-22 NOTE — Progress Notes (Signed)
Echocardiogram 2D Echocardiogram has been performed.  Veronica Kennedy 10/22/2019, 2:49 PM

## 2020-02-20 ENCOUNTER — Other Ambulatory Visit: Payer: Self-pay | Admitting: Internal Medicine

## 2020-02-20 DIAGNOSIS — Z13820 Encounter for screening for osteoporosis: Secondary | ICD-10-CM

## 2020-02-20 DIAGNOSIS — Z780 Asymptomatic menopausal state: Secondary | ICD-10-CM

## 2020-02-23 ENCOUNTER — Other Ambulatory Visit: Payer: Self-pay | Admitting: Internal Medicine

## 2020-02-23 DIAGNOSIS — Z1231 Encounter for screening mammogram for malignant neoplasm of breast: Secondary | ICD-10-CM

## 2020-04-12 ENCOUNTER — Other Ambulatory Visit: Payer: Self-pay

## 2020-04-12 ENCOUNTER — Ambulatory Visit (INDEPENDENT_AMBULATORY_CARE_PROVIDER_SITE_OTHER): Payer: Medicare HMO

## 2020-04-12 ENCOUNTER — Ambulatory Visit
Admission: EM | Admit: 2020-04-12 | Discharge: 2020-04-12 | Disposition: A | Discharge status: Home / Self Care | Payer: Medicare HMO | Attending: Family Medicine | Admitting: Family Medicine

## 2020-04-12 DIAGNOSIS — K219 Gastro-esophageal reflux disease without esophagitis: Secondary | ICD-10-CM | POA: Insufficient documentation

## 2020-04-12 DIAGNOSIS — J029 Acute pharyngitis, unspecified: Secondary | ICD-10-CM | POA: Insufficient documentation

## 2020-04-12 DIAGNOSIS — R058 Other specified cough: Secondary | ICD-10-CM | POA: Diagnosis not present

## 2020-04-12 DIAGNOSIS — Z79899 Other long term (current) drug therapy: Secondary | ICD-10-CM | POA: Insufficient documentation

## 2020-04-12 DIAGNOSIS — Z87891 Personal history of nicotine dependence: Secondary | ICD-10-CM | POA: Insufficient documentation

## 2020-04-12 DIAGNOSIS — Z8249 Family history of ischemic heart disease and other diseases of the circulatory system: Secondary | ICD-10-CM | POA: Insufficient documentation

## 2020-04-12 DIAGNOSIS — J441 Chronic obstructive pulmonary disease with (acute) exacerbation: Secondary | ICD-10-CM | POA: Insufficient documentation

## 2020-04-12 DIAGNOSIS — R519 Headache, unspecified: Secondary | ICD-10-CM | POA: Insufficient documentation

## 2020-04-12 DIAGNOSIS — I10 Essential (primary) hypertension: Secondary | ICD-10-CM | POA: Insufficient documentation

## 2020-04-12 DIAGNOSIS — Z7951 Long term (current) use of inhaled steroids: Secondary | ICD-10-CM | POA: Diagnosis not present

## 2020-04-12 DIAGNOSIS — Z20822 Contact with and (suspected) exposure to covid-19: Secondary | ICD-10-CM | POA: Insufficient documentation

## 2020-04-12 DIAGNOSIS — E7800 Pure hypercholesterolemia, unspecified: Secondary | ICD-10-CM | POA: Insufficient documentation

## 2020-04-12 LAB — GROUP A STREP BY PCR: Group A Strep by PCR: NOT DETECTED

## 2020-04-12 LAB — RESP PANEL BY RT-PCR (FLU A&B, COVID) ARPGX2
Influenza A by PCR: NEGATIVE
Influenza B by PCR: NEGATIVE
SARS Coronavirus 2 by RT PCR: NEGATIVE

## 2020-04-12 MED ORDER — AZITHROMYCIN 250 MG PO TABS: ORAL_TABLET | ORAL | 0 refills | Status: DC

## 2020-04-12 MED ORDER — BENZONATATE 200 MG PO CAPS: 200.0000 mg | ORAL_CAPSULE | Freq: Three times a day (TID) | ORAL | 0 refills | Status: DC | PRN

## 2020-04-12 MED ORDER — PREDNISONE 50 MG PO TABS: ORAL_TABLET | ORAL | 0 refills | Status: DC

## 2020-04-12 NOTE — Discharge Instructions (Addendum)
Medications as prescribed. ° °Take care ° °Dr. Vaneta Hammontree  °

## 2020-04-12 NOTE — ED Provider Notes (Signed)
[facility]    CSN: [id number] Arrival date & time: 04/12/20  1141      History   Chief Complaint Chief Complaint  Patient presents with  . Sore Throat  . Cough   HPI  64 year old female presents with the above complaints.   Patient reports she has had symptoms since Friday.  Reports productive cough, headaches, fatigue, body aches, sore throat.  No fever.  Denies shortness of breath.  She has used her inhalers with some improvement.  No reported sick contacts.  No other associated symptoms.  No other complaints   Past Medical History:  Diagnosis Date  . Asthma   . COPD (chronic obstructive pulmonary disease) ([place])   . GERD (gastroesophageal reflux disease)   . Hypercholesterolemia   . Hypertension   . Melanoma ([place])    approximately 8 years ago resected from Right lower leg.     Patient Active Problem List   Diagnosis Date Noted  . GERD (gastroesophageal reflux disease) 09/21/2012  . Stress 09/21/2012  . Essential hypertension, benign 09/19/2012  . Hypercholesterolemia 09/19/2012    Past Surgical History:  Procedure Laterality Date  . CHOLECYSTECTOMY    . TONSILLECTOMY    . TUBAL LIGATION      OB History   No obstetric history on file.      Home Medications    Prior to Admission medications   Medication Sig Start Date End Date Taking? Authorizing Provider  albuterol (PROVENTIL HFA;VENTOLIN HFA) 108 (90 BASE) MCG/ACT inhaler Inhale 2 puffs into the lungs every 6 (six) hours as needed for wheezing.   Yes [provider]  atorvastatin (LIPITOR) 10 MG tablet Take 1 tablet (10 mg total) by mouth daily. 11/08/12  Yes Einar Pheasant, MD  azithromycin (ZITHROMAX) 250 MG tablet 2 tablets on day 1, then 1 tablet daily on days 2-5. 04/12/20  Yes Beatriz Quintela G, DO  benzonatate (TESSALON) 200 MG capsule Take 1 capsule (200 mg total) by mouth 3 (three) times daily as needed for cough. 04/12/20  Yes Hanaan Gancarz G, DO  citalopram (CELEXA) 40 MG  tablet Take 1.5 tablets (60 mg total) by mouth daily. 09/21/12  Yes Einar Pheasant, MD  Fluticasone-Salmeterol (ADVAIR) 250-50 MCG/DOSE AEPB Inhale into the lungs. 05/24/18  Yes [provider]  predniSONE (DELTASONE) 50 MG tablet 1 tablet daily x 5 days 04/12/20  Yes Rainier Feuerborn G, DO  ramipril (ALTACE) 5 MG capsule Take by mouth. 10/10/19  Yes [provider]  cyclobenzaprine (FLEXERIL) 10 MG tablet Take 1 tablet (10 mg total) by mouth at bedtime as needed for muscle spasms. Do not drive while taking as can cause drowsiness 10/28/18   Marylene Land, NP  isosorbide mononitrate (IMDUR) 30 MG 24 hr tablet isosorbide mononitrate ER 30 mg tablet,extended release 24 hr    [provider]  pantoprazole (PROTONIX) 40 MG tablet TAKE 1 TABLET TWICE A DAY 01/07/18   [provider]  SPIRIVA HANDIHALER 18 MCG inhalation capsule INHALE THE CONTENTS OF 1 CAPSULE DAILY 12/21/12   Einar Pheasant, MD  traZODone (DESYREL) 150 MG tablet TAKE 1 TABLET (150 MG TOTAL) AT BEDTIME 01/07/13   Einar Pheasant, MD  esomeprazole (NEXIUM) 40 MG capsule Take 1 capsule (40 mg total) by mouth daily. 12/02/12 10/28/18  Einar Pheasant, MD  fluticasone (FLONASE) 50 MCG/ACT nasal spray Place 2 sprays into both nostrils daily. 07/15/15 10/28/18  Frederich Cha, MD  hydrochlorothiazide (HYDRODIURIL) 25 MG tablet Take 1 tablet (25 mg total) by mouth daily.  09/30/12 04/12/20  Dale Rowley, MD    Family History Family History  Problem Relation Age of Onset  . Breast cancer Maternal Aunt 50  . Dementia Mother   . Heart disease Mother   . Diabetes Mother   . Heart attack Father   . AAA (abdominal aortic aneurysm) Father   . Hypertension Father   . Hyperlipidemia Father     Social History Social History   Tobacco Use  . Smoking status: Former Smoker    Quit date: 06/08/2005    Years since quitting: 14.8  . Smokeless tobacco: Never Used  Vaping Use  . Vaping Use: Never used  Substance Use Topics   . Alcohol use: No  . Drug use: No     Allergies   Patient has no known allergies.   Review of Systems Review of Systems Per HPI  Physical Exam Triage Vital Signs ED Triage Vitals  Enc Vitals Group     BP 04/12/20 1317 134/85     Pulse Rate 04/12/20 1317 85     Resp 04/12/20 1317 20     Temp 04/12/20 1317 99.7 F (37.6 C)     Temp Source 04/12/20 1317 Oral     SpO2 04/12/20 1317 97 %     Weight --      Height --      Head Circumference --      Peak Flow --      Pain Score 04/12/20 1309 8     Pain Loc --      Pain Edu? --      Excl. in [place]? --    Updated Vital Signs BP 134/85 (BP Location: Left Arm)   Pulse 85   Temp 99.7 F (37.6 C) (Oral)   Resp 20   SpO2 97%   Visual Acuity Right Eye Distance:   Left Eye Distance:   Bilateral Distance:    Right Eye Near:   Left Eye Near:    Bilateral Near:     Physical Exam Constitutional:      General: She is not in acute distress.    Appearance: Normal appearance. She is not ill-appearing.  HENT:     Head: Normocephalic and atraumatic.     Right Ear: Tympanic membrane normal.     Left Ear: Tympanic membrane normal.     Mouth/Throat:     Pharynx: Posterior oropharyngeal erythema present. No oropharyngeal exudate.  Cardiovascular:     Rate and Rhythm: Normal rate and regular rhythm.  Pulmonary:     Effort: Pulmonary effort is normal.     Comments: Faint expiratory wheezing.  Basilar crackles noted. Neurological:     Mental Status: She is alert.  Psychiatric:        Mood and Affect: Mood normal.        Behavior: Behavior normal.    UC Treatments / Results  Labs (all labs ordered are listed, but only abnormal results are displayed) Labs Reviewed  RESP PANEL BY RT-PCR (FLU A&B, COVID) [id number]  GROUP A STREP BY PCR    EKG   Radiology DG Chest 2 View  Result Date: 04/12/2020 CLINICAL DATA:  Productive cough EXAM: CHEST - 2 VIEW COMPARISON:  04/20/2018 FINDINGS: The heart size and mediastinal contours  are within normal limits. Both lungs are clear. The visualized skeletal structures are unremarkable. IMPRESSION: No active cardiopulmonary disease. Electronically Signed   By: Marlan Palau M.D.   On: 04/12/2020 14:27    Procedures Procedures (including critical  care time)  Medications Ordered in UC Medications - No data to display  Initial Impression / Assessment and Plan / UC Course  I have reviewed the triage vital signs and the nursing notes.  Pertinent labs & imaging results that were available during my care of the patient were reviewed by me and considered in my medical decision making (see chart for details).    64 year old female presents with COPD exacerbation.  Flu and Covid testing negative today.  Strep negative.  Chest x-ray obtained given symptomatology and lung findings.  Chest x-ray independently interpreted.  Interpretation: No acute findings.  This appears to be respiratory infection which has led to COPD exacerbation.  Prednisone, azithromycin, and Tessalon Perles as prescribed.  Final Clinical Impressions(s) / UC Diagnoses   Final diagnoses:  COPD exacerbation [facility]: [place] Rhonda W)     Discharge Instructions     Medications as prescribed.  Take care  Dr. Adriana Simas    ED Prescriptions    Medication Sig Dispense Auth. Provider   predniSONE (DELTASONE) 50 MG tablet 1 tablet daily x 5 days 5 tablet Desia Saban G, DO   azithromycin (ZITHROMAX) 250 MG tablet 2 tablets on day 1, then 1 tablet daily on days 2-5. 6 tablet Giavonni Cizek G, DO   benzonatate (TESSALON) 200 MG capsule Take 1 capsule (200 mg total) by mouth 3 (three) times daily as needed for cough. 30 capsule Tommie Sams, DO     PDMP not reviewed this encounter.   Tommie Sams, [place] 04/12/20 2010

## 2020-04-12 NOTE — ED Triage Notes (Signed)
Pt c/o productive cough with yellow sputum, HA, fatigue, body aches, sore throat, onset Friday evening.  Denies fever, n/v/d, SOB, loss of taste/smell. Oropharynx with mild erythema. Pt reports she used her inhaler this morning with improvement of respiratory symptoms.   Bilateral exp wheezes with diminished exchange at bases.

## 2020-04-27 ENCOUNTER — Ambulatory Visit
Admission: RE | Admit: 2020-04-27 | Discharge: 2020-04-27 | Disposition: A | Discharge status: Home / Self Care | Payer: Medicare HMO | Source: Ambulatory Visit | Attending: Internal Medicine | Admitting: Internal Medicine

## 2020-04-27 ENCOUNTER — Other Ambulatory Visit: Payer: Self-pay

## 2020-04-27 DIAGNOSIS — Z780 Asymptomatic menopausal state: Secondary | ICD-10-CM | POA: Insufficient documentation

## 2020-04-27 DIAGNOSIS — Z13820 Encounter for screening for osteoporosis: Secondary | ICD-10-CM | POA: Insufficient documentation

## 2020-04-27 DIAGNOSIS — Z1231 Encounter for screening mammogram for malignant neoplasm of breast: Secondary | ICD-10-CM | POA: Diagnosis present

## 2020-05-22 ENCOUNTER — Ambulatory Visit: Payer: Self-pay

## 2021-02-11 ENCOUNTER — Other Ambulatory Visit: Payer: Self-pay

## 2021-02-11 ENCOUNTER — Ambulatory Visit
Admission: RE | Admit: 2021-02-11 | Discharge: 2021-02-11 | Disposition: A | Discharge status: Home / Self Care | Payer: Medicare HMO | Source: Ambulatory Visit | Attending: Physician Assistant | Admitting: Physician Assistant

## 2021-02-11 VITALS — BP 145/86 | HR 68 | Temp 98.0°F | Resp 16 | Ht 65.0 in | Wt 182.0 lb

## 2021-02-11 DIAGNOSIS — S46811A Strain of other muscles, fascia and tendons at shoulder and upper arm level, right arm, initial encounter: Secondary | ICD-10-CM

## 2021-02-11 DIAGNOSIS — G44209 Tension-type headache, unspecified, not intractable: Secondary | ICD-10-CM | POA: Diagnosis not present

## 2021-02-11 MED ORDER — CYCLOBENZAPRINE HCL 10 MG PO TABS: 10.0000 mg | ORAL_TABLET | Freq: Three times a day (TID) | ORAL | 0 refills | Status: AC | PRN

## 2021-02-11 MED ORDER — SUMATRIPTAN SUCCINATE 25 MG PO TABS: ORAL_TABLET | ORAL | 0 refills | Status: AC

## 2021-02-11 MED ORDER — KETOROLAC TROMETHAMINE 60 MG/2ML IM SOLN
30.0000 mg | Freq: Once | INTRAMUSCULAR | Status: AC
  Administered 2021-02-11: 30 mg via INTRAMUSCULAR

## 2021-02-11 MED ORDER — CYCLOBENZAPRINE HCL 10 MG PO TABS: 10.0000 mg | ORAL_TABLET | Freq: Three times a day (TID) | ORAL | 0 refills | Status: DC | PRN

## 2021-02-11 NOTE — Discharge Instructions (Signed)
-  Start your home prednisone -Take the Sumatriptan as needed/directed for headache -Take the cyclobenzaprine every 8 hours as needed for muscle pain and spasm.  This can make you drowsy so be careful taking it.  Do not take it during the day for makes you too sleepy.  You can also continue Tylenol for pain. -Additionally I would consider considering the muscle rubs, lidocaine patches and applying heat to the area.  May need to go to physical therapy this is still bothering you.  Keep appointment with PCP on Monday.

## 2021-02-11 NOTE — ED Triage Notes (Signed)
Pt here with C/O bilaterial shoulder blade pain for 3 weeks, pt states it is causing headaches now.

## 2021-02-11 NOTE — ED Provider Notes (Signed)
[facility]    CSN: [id number] Arrival date & time: 02/11/21  6759      History   Chief Complaint Chief Complaint  Patient presents with   Shoulder Pain    APPOINTMENT    HPI Veronica Kennedy is a 65 y.o. female presenting for 3-week history of right sided trapezius, posterior shoulder pain and right-sided neck pain.  Patient denies any injury.  States she works at [employer] and [employer] a lot.  Whenever she raises her right arm it is painful.  Patient also admits to headaches.  States she has a history of headaches and has taken Imitrex in the past for headaches.  She says she only has 1 pill left but when she takes it does help.  She would like a refill.  Patient denies any associated nausea or vomiting, dizziness, photophobia or phonophobia.  Increased pain when she moves her neck.  Mild neck stiffness.  No radiation of pain down extremities, numbness/weakness or tingling.  Patient has cyclobenzaprine which she takes at night but says she is out of it currently.  She has been taking Tylenol Extra Strength for pain but says it does not help.  Patient has appointment with PCP in 3 days.  No other complaints.  HPI  Past Medical History:  Diagnosis Date   Asthma    COPD (chronic obstructive pulmonary disease) ([place])    GERD (gastroesophageal reflux disease)    Hypercholesterolemia    Hypertension    Melanoma ([place])    approximately 8 years ago resected from Right lower leg.     Patient Active Problem List   Diagnosis Date Noted   GERD (gastroesophageal reflux disease) 09/21/2012   Stress 09/21/2012   Essential hypertension, benign 09/19/2012   Hypercholesterolemia 09/19/2012    Past Surgical History:  Procedure Laterality Date   CHOLECYSTECTOMY     TONSILLECTOMY     TUBAL LIGATION      OB History   No obstetric history on file.      Home Medications    Prior to Admission medications   Medication Sig Start Date End Date Taking? Authorizing Provider   albuterol (PROVENTIL HFA;VENTOLIN HFA) 108 (90 BASE) MCG/ACT inhaler Inhale 2 puffs into the lungs every 6 (six) hours as needed for wheezing.   Yes [provider]  atorvastatin (LIPITOR) 10 MG tablet Take 1 tablet (10 mg total) by mouth daily. 11/08/12  Yes Einar Pheasant, MD  azithromycin (ZITHROMAX) 250 MG tablet 2 tablets on day 1, then 1 tablet daily on days 2-5. 04/12/20  Yes Cook, Jayce G, DO  benzonatate (TESSALON) 200 MG capsule Take 1 capsule (200 mg total) by mouth 3 (three) times daily as needed for cough. 04/12/20  Yes Cook, Jayce G, DO  citalopram (CELEXA) 40 MG tablet Take 1.5 tablets (60 mg total) by mouth daily. 09/21/12  Yes Einar Pheasant, MD  Fluticasone-Salmeterol (ADVAIR) 250-50 MCG/DOSE AEPB Inhale into the lungs. 05/24/18  Yes [provider]  isosorbide mononitrate (IMDUR) 30 MG 24 hr tablet isosorbide mononitrate ER 30 mg tablet,extended release 24 hr   Yes [provider]  pantoprazole (PROTONIX) 40 MG tablet TAKE 1 TABLET TWICE A DAY 01/07/18  Yes [provider]  predniSONE (DELTASONE) 50 MG tablet 1 tablet daily x 5 days 04/12/20  Yes Cook, Jayce G, DO  ramipril (ALTACE) 5 MG capsule Take by mouth. 10/10/19  Yes [provider]  SPIRIVA HANDIHALER 18 MCG inhalation capsule INHALE THE CONTENTS OF 1 CAPSULE DAILY  12/21/12  Yes Einar Pheasant, MD  SUMAtriptan (IMITREX) 25 MG tablet May repeat in 2 hours if headache persists or recurs. 02/11/21  Yes Danton Clap, PA-C  traZODone (DESYREL) 150 MG tablet TAKE 1 TABLET (150 MG TOTAL) AT BEDTIME 01/07/13  Yes Einar Pheasant, MD  cyclobenzaprine (FLEXERIL) 10 MG tablet Take 1 tablet (10 mg total) by mouth 3 (three) times daily as needed for muscle spasms. Do not drive while taking as can cause drowsiness 02/11/21   Laurene Footman B, PA-C  esomeprazole (NEXIUM) 40 MG capsule Take 1 capsule (40 mg total) by mouth daily. 12/02/12 10/28/18  Einar Pheasant, MD  fluticasone (FLONASE) 50 MCG/ACT  nasal spray Place 2 sprays into both nostrils daily. 07/15/15 10/28/18  Frederich Cha, MD  hydrochlorothiazide (HYDRODIURIL) 25 MG tablet Take 1 tablet (25 mg total) by mouth daily. 09/30/12 04/12/20  Einar Pheasant, MD    Family History Family History  Problem Relation Age of Onset   Breast cancer Maternal Aunt 45   Dementia Mother    Heart disease Mother    Diabetes Mother    Heart attack Father    AAA (abdominal aortic aneurysm) Father    Hypertension Father    Hyperlipidemia Father     Social History Social History   Tobacco Use   Smoking status: Former    Types: Cigarettes    Quit date: 06/08/2005    Years since quitting: 15.6   Smokeless tobacco: Never  Vaping Use   Vaping Use: Never used  Substance Use Topics   Alcohol use: No   Drug use: No     Allergies   Patient has no known allergies.   Review of Systems Review of Systems  Constitutional:  Negative for fatigue and fever.  Eyes:  Negative for photophobia and visual disturbance.  Respiratory:  Negative for cough and shortness of breath.   Cardiovascular:  Negative for chest pain.  Musculoskeletal:  Positive for arthralgias, back pain (right upper back, posterior shoulder), neck pain and neck stiffness.  Skin:  Negative for rash.  Neurological:  Negative for dizziness, syncope, weakness, numbness and headaches.    Physical Exam Triage Vital Signs ED Triage Vitals  Enc Vitals Group     BP 02/11/21 0922 (!) 145/86     Pulse Rate 02/11/21 0922 68     Resp 02/11/21 0922 16     Temp 02/11/21 0922 98 F (36.7 C)     Temp Source 02/11/21 0922 Oral     SpO2 02/11/21 0922 96 %     Weight 02/11/21 0921 182 lb (82.6 kg)     Height 02/11/21 0921 5\' 5"  (1.651 m)     Head Circumference --      Peak Flow --      Pain Score 02/11/21 0921 7     Pain Loc --      Pain Edu? --      Excl. in [place]? --    No data found.  Updated Vital Signs BP (!) 145/86 (BP Location: Right Arm)   Pulse 68   Temp 98 F (36.7 C)  (Oral)   Resp 16   Ht 5\' 5"  (1.651 m)   Wt 182 lb (82.6 kg)   SpO2 96%   BMI 30.29 kg/m    Physical Exam Vitals and nursing note reviewed.  Constitutional:      General: She is not in acute distress.    Appearance: Normal appearance. She is not ill-appearing or toxic-appearing.  HENT:  Head: Normocephalic and atraumatic.  Eyes:     General: No scleral icterus.       Right eye: No discharge.        Left eye: No discharge.     Extraocular Movements: Extraocular movements intact.     Conjunctiva/sclera: Conjunctivae normal.     Pupils: Pupils are equal, round, and reactive to light.  Cardiovascular:     Rate and Rhythm: Normal rate and regular rhythm.     Heart sounds: Normal heart sounds.  Pulmonary:     Effort: Pulmonary effort is normal. No respiratory distress.     Breath sounds: Normal breath sounds.  Musculoskeletal:     Cervical back: Neck supple. Tenderness present. No bony tenderness. Pain with movement present. Decreased range of motion.     Thoracic back: Normal range of motion.       Back:     Comments: TTP right trapezius, right paracervical muscles  Skin:    General: Skin is dry.  Neurological:     General: No focal deficit present.     Mental Status: She is alert and oriented to person, place, and time. Mental status is at baseline.     Cranial Nerves: No cranial nerve deficit.     Motor: No weakness.     Gait: Gait normal.  Psychiatric:        Mood and Affect: Mood normal.        Behavior: Behavior normal.        Thought Content: Thought content normal.     UC Treatments / Results  Labs (all labs ordered are listed, but only abnormal results are displayed) Labs Reviewed - No data to display  EKG   Radiology No results found.  Procedures Procedures (including critical care time)  Medications Ordered in UC Medications  ketorolac (TORADOL) injection 30 mg (30 mg Intramuscular Given 02/11/21 0949)    Initial Impression / Assessment and  Plan / UC Course  I have reviewed the triage vital signs and the nursing notes.  Pertinent labs & imaging results that were available during my care of the patient were reviewed by me and considered in my medical decision making (see chart for details).  65 year old female presenting for right trapezius pain, right-sided neck pain and headaches for the past 3 weeks.  No injury.  Has tried Tylenol Extra Strength without improvement in symptoms.  No red flag signs or symptoms elicited on exam.  Symptoms consistent with trapezius muscle strain, tension type headache.  Patient given 30 mg IM ketorolac in clinic.  Advised patient she may benefit from prednisone taper.  She says she has prednisone at home so she will just take which she has at home.  I have refilled her cyclobenzaprine and advised she can take it during the day if it does not make her too drowsy.  Continue the Tylenol, muscle rubs, heat.  I refilled the sumatriptan as well.  Advised keep appointment with PCP in the next 3 days but go to ER sooner for any acute worsening of symptoms.  Patient may benefit from physical therapy if not improving after the prednisone taper.   Final Clinical Impressions(s) / UC Diagnoses   Final diagnoses:  Trapezius muscle strain, right, initial encounter  Tension headache     Discharge Instructions      -Start your home prednisone -Take the Sumatriptan as needed/directed for headache -Take the cyclobenzaprine every 8 hours as needed for muscle pain and spasm.  This can make you  drowsy so be careful taking it.  Do not take it during the day for makes you too sleepy.  You can also continue Tylenol for pain. -Additionally I would consider considering the muscle rubs, lidocaine patches and applying heat to the area.  May need to go to physical therapy this is still bothering you.  Keep appointment with PCP on Monday.     ED Prescriptions     Medication Sig Dispense Auth. Provider   cyclobenzaprine  (FLEXERIL) 10 MG tablet  (Status: Discontinued) Take 1 tablet (10 mg total) by mouth 3 (three) times daily as needed for muscle spasms. Do not drive while taking as can cause drowsiness 30 tablet Laurene Footman B, PA-C   SUMAtriptan (IMITREX) 25 MG tablet May repeat in 2 hours if headache persists or recurs. 10 tablet Laurene Footman B, PA-C   cyclobenzaprine (FLEXERIL) 10 MG tablet Take 1 tablet (10 mg total) by mouth 3 (three) times daily as needed for muscle spasms. Do not drive while taking as can cause drowsiness 30 tablet Danton Clap, PA-C      I have reviewed the PDMP during this encounter.   Laurene Footman B, PA-C 02/11/21 1000

## 2021-03-23 ENCOUNTER — Ambulatory Visit (INDEPENDENT_AMBULATORY_CARE_PROVIDER_SITE_OTHER): Payer: Medicare HMO

## 2021-03-23 ENCOUNTER — Ambulatory Visit
Admission: RE | Admit: 2021-03-23 | Discharge: 2021-03-23 | Disposition: A | Discharge status: Home / Self Care | Payer: Medicare HMO | Source: Ambulatory Visit | Attending: Emergency Medicine | Admitting: Emergency Medicine

## 2021-03-23 ENCOUNTER — Other Ambulatory Visit: Payer: Self-pay

## 2021-03-23 VITALS — BP 128/81 | HR 85 | Temp 98.5°F | Resp 18

## 2021-03-23 DIAGNOSIS — R509 Fever, unspecified: Secondary | ICD-10-CM

## 2021-03-23 DIAGNOSIS — R5383 Other fatigue: Secondary | ICD-10-CM

## 2021-03-23 DIAGNOSIS — J40 Bronchitis, not specified as acute or chronic: Secondary | ICD-10-CM

## 2021-03-23 DIAGNOSIS — R059 Cough, unspecified: Secondary | ICD-10-CM | POA: Diagnosis not present

## 2021-03-23 MED ORDER — PROMETHAZINE-DM 6.25-15 MG/5ML PO SYRP: 5.0000 mL | ORAL_SOLUTION | Freq: Four times a day (QID) | ORAL | 0 refills | Status: DC | PRN

## 2021-03-23 MED ORDER — PREDNISONE 50 MG PO TABS: ORAL_TABLET | ORAL | 0 refills | Status: DC

## 2021-03-23 MED ORDER — DOXYCYCLINE HYCLATE 100 MG PO CAPS: 100.0000 mg | ORAL_CAPSULE | Freq: Two times a day (BID) | ORAL | 0 refills | Status: DC

## 2021-03-23 MED ORDER — BENZONATATE 100 MG PO CAPS: 200.0000 mg | ORAL_CAPSULE | Freq: Three times a day (TID) | ORAL | 0 refills | Status: DC

## 2021-03-23 NOTE — ED Triage Notes (Signed)
Pt c/o cough and fever x 5 days

## 2021-03-23 NOTE — Discharge Instructions (Signed)
Use the albuterol inhaler/nebulizer every 4-6 hours as needed for shortness of breath, wheezing, and cough.  Take the prednisone according to the package instructions to help with pulmonary inflammation.  Use the Tessalon Perles every 8 hours for your cough.  Taken with a small sip of water.  They may give you some numbness to the base of your tongue or metallic taste in her mouth, this is normal.  They are designed to calm down the cough reflex.  Use the Promethazine DM cough syrup at bedtime as will make you drowsy.  You may take 1 teaspoon (5 mL) every 6 hours.  Return for reevaluation for new or worsening symptoms.  Take the doxycycline with food twice daily for 10 days.

## 2021-03-23 NOTE — ED Provider Notes (Signed)
[facility]    CSN: [id number] Arrival date & time: 03/23/21  1241      History   Chief Complaint Chief Complaint  Patient presents with   Cough   Fever    HPI Veronica Kennedy is a 65 y.o. female.   HPI  65 year old female here for evaluation of respiratory complaints.  Patient reports that for the last 5 days she has been experiencing a productive cough for yellow-green mucus, shortness of breath and wheezing, weakness, and fever.  She states she has not measured her fever at home.  She is complaining of a sore throat but thinks it secondary to coughing.  She reports her symptoms began with hoarseness initially.  She denies runny nose nasal congestion, GI complaints, ear pain, or sick contacts.  Past Medical History:  Diagnosis Date   Asthma    COPD (chronic obstructive pulmonary disease) (HCC)    GERD (gastroesophageal reflux disease)    Hypercholesterolemia    Hypertension    Melanoma ([place])    approximately 8 years ago resected from Right lower leg.     Patient Active Problem List   Diagnosis Date Noted   GERD (gastroesophageal reflux disease) 09/21/2012   Stress 09/21/2012   Essential hypertension, benign 09/19/2012   Hypercholesterolemia 09/19/2012    Past Surgical History:  Procedure Laterality Date   CHOLECYSTECTOMY     TONSILLECTOMY     TUBAL LIGATION      OB History   No obstetric history on file.      Home Medications    Prior to Admission medications   Medication Sig Start Date End Date Taking? Authorizing Provider  benzonatate (TESSALON) 100 MG capsule Take 2 capsules (200 mg total) by mouth every 8 (eight) hours. 03/23/21  Yes Margarette Canada, NP  doxycycline (VIBRAMYCIN) 100 MG capsule Take 1 capsule (100 mg total) by mouth 2 (two) times daily. 03/23/21  Yes Margarette Canada, NP  predniSONE (DELTASONE) 50 MG tablet Take 1 tablet daily by mouth for 5 days. 03/23/21  Yes Margarette Canada, NP  promethazine-dextromethorphan (PROMETHAZINE-DM)  6.25-15 MG/5ML syrup Take 5 mLs by mouth 4 (four) times daily as needed. 03/23/21  Yes Margarette Canada, NP  albuterol (PROVENTIL HFA;VENTOLIN HFA) 108 (90 BASE) MCG/ACT inhaler Inhale 2 puffs into the lungs every 6 (six) hours as needed for wheezing.    [provider]  atorvastatin (LIPITOR) 10 MG tablet Take 1 tablet (10 mg total) by mouth daily. 11/08/12   Einar Pheasant, MD  citalopram (CELEXA) 40 MG tablet Take 1.5 tablets (60 mg total) by mouth daily. 09/21/12   Einar Pheasant, MD  cyclobenzaprine (FLEXERIL) 10 MG tablet Take 1 tablet (10 mg total) by mouth 3 (three) times daily as needed for muscle spasms. Do not drive while taking as can cause drowsiness 02/11/21   Danton Clap, PA-C  Fluticasone-Salmeterol (ADVAIR) 250-50 MCG/DOSE AEPB Inhale into the lungs. 05/24/18   [provider]  isosorbide mononitrate (IMDUR) 30 MG 24 hr tablet isosorbide mononitrate ER 30 mg tablet,extended release 24 hr    [provider]  pantoprazole (PROTONIX) 40 MG tablet TAKE 1 TABLET TWICE A DAY 01/07/18   [provider]  ramipril (ALTACE) 5 MG capsule Take by mouth. 10/10/19   [provider]  SPIRIVA HANDIHALER 18 MCG inhalation capsule INHALE THE CONTENTS OF 1 CAPSULE DAILY 12/21/12   Einar Pheasant, MD  SUMAtriptan (IMITREX) 25 MG tablet May repeat in 2 hours if headache persists or recurs. 02/11/21  Danton Clap, PA-C  traZODone (DESYREL) 150 MG tablet TAKE 1 TABLET (150 MG TOTAL) AT BEDTIME 01/07/13   Einar Pheasant, MD  esomeprazole (NEXIUM) 40 MG capsule Take 1 capsule (40 mg total) by mouth daily. 12/02/12 10/28/18  Einar Pheasant, MD  fluticasone (FLONASE) 50 MCG/ACT nasal spray Place 2 sprays into both nostrils daily. 07/15/15 10/28/18  Frederich Cha, MD  hydrochlorothiazide (HYDRODIURIL) 25 MG tablet Take 1 tablet (25 mg total) by mouth daily. 09/30/12 04/12/20  Einar Pheasant, MD    Family History Family History  Problem Relation Age of Onset   Breast  cancer Maternal Aunt 35   Dementia Mother    Heart disease Mother    Diabetes Mother    Heart attack Father    AAA (abdominal aortic aneurysm) Father    Hypertension Father    Hyperlipidemia Father     Social History Social History   Tobacco Use   Smoking status: Former    Types: Cigarettes    Quit date: 06/08/2005    Years since quitting: 15.8   Smokeless tobacco: Never  Vaping Use   Vaping Use: Never used  Substance Use Topics   Alcohol use: No   Drug use: No     Allergies   Other   Review of Systems Review of Systems  Constitutional:  Positive for diaphoresis and fever. Negative for activity change and appetite change.  HENT:  Positive for sore throat. Negative for congestion, ear pain and rhinorrhea.   Respiratory:  Positive for cough, shortness of breath and wheezing.   Gastrointestinal:  Negative for diarrhea, nausea and vomiting.  Musculoskeletal:  Negative for arthralgias and myalgias.  Skin:  Negative for rash.  Hematological: Negative.   Psychiatric/Behavioral: Negative.      Physical Exam Triage Vital Signs ED Triage Vitals [03/23/21 1309]  Enc Vitals Group     BP 128/81     Pulse Rate 85     Resp 18     Temp 98.5 F (36.9 C)     Temp Source Oral     SpO2 98 %     Weight      Height      Head Circumference      Peak Flow      Pain Score      Pain Loc      Pain Edu?      Excl. in [place]?    No data found.  Updated Vital Signs BP 128/81 (BP Location: Left Arm)   Pulse 85   Temp 98.5 F (36.9 C) (Oral)   Resp 18   SpO2 98%   Visual Acuity Right Eye Distance:   Left Eye Distance:   Bilateral Distance:    Right Eye Near:   Left Eye Near:    Bilateral Near:     Physical Exam Vitals and nursing note reviewed.  Constitutional:      General: She is not in acute distress.    Appearance: Normal appearance. She is ill-appearing.  HENT:     Head: Normocephalic and atraumatic.     Right Ear: Tympanic membrane, ear canal and external ear  normal. There is no impacted cerumen.     Left Ear: Tympanic membrane, ear canal and external ear normal. There is no impacted cerumen.     Nose: Nose normal. No congestion or rhinorrhea.     Mouth/Throat:     Mouth: Mucous membranes are moist.     Pharynx: Oropharynx is clear. No posterior oropharyngeal erythema.  Cardiovascular:     Rate and Rhythm: Normal rate and regular rhythm.     Pulses: Normal pulses.     Heart sounds: Normal heart sounds. No murmur heard.   No gallop.  Pulmonary:     Effort: Pulmonary effort is normal.     Breath sounds: Wheezing and rhonchi present. No rales.  Musculoskeletal:     Cervical back: Normal range of motion and neck supple.  Lymphadenopathy:     Cervical: No cervical adenopathy.  Skin:    General: Skin is warm and dry.     Capillary Refill: Capillary refill takes less than 2 seconds.     Findings: No erythema or rash.  Neurological:     General: No focal deficit present.     Mental Status: She is alert and oriented to person, place, and time.  Psychiatric:        Mood and Affect: Mood normal.        Behavior: Behavior normal.        Thought Content: Thought content normal.        Judgment: Judgment normal.     UC Treatments / Results  Labs (all labs ordered are listed, but only abnormal results are displayed) Labs Reviewed - No data to display  EKG   Radiology DG Chest 2 View  Result Date: 03/23/2021 CLINICAL DATA:  Cough, fatigue, and fever for 5 days. EXAM: CHEST - 2 VIEW COMPARISON:  Chest radiographs 04/12/2020 FINDINGS: The cardiomediastinal silhouette is unchanged with normal heart size. The lungs are hyperinflated. No airspace consolidation, edema, pleural effusion, or pneumothorax is identified. No acute osseous abnormality is seen. IMPRESSION: No active cardiopulmonary disease. Electronically Signed   By: Logan Bores M.D.   On: 03/23/2021 14:13    Procedures Procedures (including critical care time)  Medications Ordered  in UC Medications - No data to display  Initial Impression / Assessment and Plan / UC Course  I have reviewed the triage vital signs and the nursing notes.  Pertinent labs & imaging results that were available during my care of the patient were reviewed by me and considered in my medical decision making (see chart for details).  Patient is a pleasant though ill-appearing 27 old female here for evaluation of fever and productive cough that been ongoing for the past 5 days.  Patient's fever has been subjective as she has not measured at home but she has had some sweating episodes.  She is also complaining of shortness of breath and wheezing and weakness.  She does have COPD but states she has not had increased use of her rescue inhaler at all during the course of this illness.  On exam patient's upper respiratory tree is benign.  Cardiopulmonary exam reveals S1 and S2 heart sounds that are free of murmur, rub, and gallop.  Lung sounds have diffuse wheezing in all lung fields and rhonchi in bilateral upper lobes.  Differential diagnosis includes COPD exacerbation, pneumonia, and  bronchitis.  We will obtain chest x-ray to look for acute cardiopulmonary process.  Chest x-ray independently reviewed and evaluated by me.  Impression: Lung spaces are well pneumatized.  No evidence of infiltrate or effusion.  Pulmonary vasculature is prominent.  Radiology overread is pending. Radiology impression is negative for any acute cardiopulmonary process.  Will discharge patient home with a diagnosis of bronchitis and place her on doxycycline twice daily for 10 days, prednisone, Tessalon Perles and Promethazine DM cough syrup for cough.   Final Clinical Impressions(s) / UC  Diagnoses   Final diagnoses:  Bronchitis     Discharge Instructions      Use the albuterol inhaler/nebulizer every 4-6 hours as needed for shortness of breath, wheezing, and cough.  Take the prednisone according to the package  instructions to help with pulmonary inflammation.  Use the Tessalon Perles every 8 hours for your cough.  Taken with a small sip of water.  They may give you some numbness to the base of your tongue or metallic taste in her mouth, this is normal.  They are designed to calm down the cough reflex.  Use the Promethazine DM cough syrup at bedtime as will make you drowsy.  You may take 1 teaspoon (5 mL) every 6 hours.  Return for reevaluation for new or worsening symptoms.  Take the doxycycline with food twice daily for 10 days.     ED Prescriptions     Medication Sig Dispense Auth. Provider   doxycycline (VIBRAMYCIN) 100 MG capsule Take 1 capsule (100 mg total) by mouth 2 (two) times daily. 20 capsule Margarette Canada, NP   benzonatate (TESSALON) 100 MG capsule Take 2 capsules (200 mg total) by mouth every 8 (eight) hours. 21 capsule Margarette Canada, NP   promethazine-dextromethorphan (PROMETHAZINE-DM) 6.25-15 MG/5ML syrup Take 5 mLs by mouth 4 (four) times daily as needed. 118 mL Margarette Canada, NP   predniSONE (DELTASONE) 50 MG tablet Take 1 tablet daily by mouth for 5 days. 5 tablet Margarette Canada, NP      PDMP not reviewed this encounter.   Margarette Canada, NP 03/23/21 1419

## 2021-03-28 ENCOUNTER — Other Ambulatory Visit: Payer: Self-pay | Admitting: Family Medicine

## 2021-03-28 DIAGNOSIS — Z1231 Encounter for screening mammogram for malignant neoplasm of breast: Secondary | ICD-10-CM

## 2021-03-31 ENCOUNTER — Ambulatory Visit
Admission: RE | Admit: 2021-03-31 | Discharge: 2021-03-31 | Disposition: A | Discharge status: Home / Self Care | Payer: Medicare HMO | Source: Ambulatory Visit | Attending: Physician Assistant | Admitting: Physician Assistant

## 2021-03-31 ENCOUNTER — Other Ambulatory Visit: Payer: Self-pay

## 2021-03-31 VITALS — BP 114/72 | HR 99 | Temp 98.9°F | Resp 18 | Ht 64.0 in | Wt 178.0 lb

## 2021-03-31 DIAGNOSIS — R051 Acute cough: Secondary | ICD-10-CM | POA: Diagnosis not present

## 2021-03-31 DIAGNOSIS — J441 Chronic obstructive pulmonary disease with (acute) exacerbation: Secondary | ICD-10-CM | POA: Diagnosis not present

## 2021-03-31 DIAGNOSIS — R1030 Lower abdominal pain, unspecified: Secondary | ICD-10-CM | POA: Diagnosis not present

## 2021-03-31 LAB — URINALYSIS, COMPLETE (UACMP) WITH MICROSCOPIC
Glucose, UA: NEGATIVE mg/dL
Hgb urine dipstick: NEGATIVE
Leukocytes,Ua: NEGATIVE
Nitrite: NEGATIVE
Protein, ur: 100 mg/dL — AB
Specific Gravity, Urine: 1.025 (ref 1.005–1.030)
pH: 7 (ref 5.0–8.0)

## 2021-03-31 MED ORDER — PREDNISONE 20 MG PO TABS: 40.0000 mg | ORAL_TABLET | Freq: Every day | ORAL | 0 refills | Status: AC

## 2021-03-31 MED ORDER — AMOXICILLIN-POT CLAVULANATE 875-125 MG PO TABS: 1.0000 | ORAL_TABLET | Freq: Two times a day (BID) | ORAL | 0 refills | Status: AC

## 2021-03-31 MED ORDER — CHERATUSSIN AC 100-10 MG/5ML PO SOLN: 10.0000 mL | Freq: Three times a day (TID) | ORAL | 0 refills | Status: DC | PRN

## 2021-03-31 NOTE — ED Triage Notes (Signed)
Pt states that she was seen last Thursday for Bronchitis and is now having pain along her lower stomach. Pt denies any constipation or diarrhea.

## 2021-03-31 NOTE — Discharge Instructions (Addendum)
-  Your urine test does not appear to be consistent with UTI. - Since you do not have fever, has normal vitals, have poorly localized abdominal pain and no other associated symptoms including no fever, nausea/vomiting/diarrhea/constipation or black or bloody stools or urinary symptoms, I suspect your abdominal pain is probably from coughing so much. -You have already been treated for COPD exacerbation last week but we can try something different this week.  I have sent more prednisone and a different antibiotics feels different cough medicine.  He does have codeine and is to be careful as it can make you sleepy. - If your abdominal pain worsens or is associated with any of those symptoms I mentioned above, you should be seen again immediately.

## 2021-03-31 NOTE — ED Provider Notes (Signed)
[facility]    CSN: [id number] Arrival date & time: 03/31/21  0941      History   Chief Complaint Chief Complaint  Patient presents with   Abdominal Pain    Appt at 10    HPI Veronica Kennedy is a 65 y.o. female presenting for approximately 2-week history of cough and congestion.  Patient was seen in clinic 8 days ago and treated for bronchitis and exacerbation of her COPD with doxycycline, Promethazine DM, benzonatate, and prednisone.  Reports that she does not feel any better or worse from when she was here last week.  She did have a x-ray of her chest which was negative.  Patient says cough is productive of yellowish-green mucus.  Reports some shortness of breath and wheezing.  She has not had any recent fevers.  Patient says over the past several days she started develop lower abdominal pain which is not associated with nausea/vomiting/diarrhea/constipation.  Denies any dysuria, Rosanne Gutting frequency or urgency.  Thinks it could be from coughing so much.  Patient says the cough medication that she was prescribed makes her cough more.  HPI  Past Medical History:  Diagnosis Date   Asthma    COPD (chronic obstructive pulmonary disease) ([place])    GERD (gastroesophageal reflux disease)    Hypercholesterolemia    Hypertension    Melanoma ([place])    approximately 8 years ago resected from Right lower leg.     Patient Active Problem List   Diagnosis Date Noted   GERD (gastroesophageal reflux disease) 09/21/2012   Stress 09/21/2012   Essential hypertension, benign 09/19/2012   Hypercholesterolemia 09/19/2012    Past Surgical History:  Procedure Laterality Date   CHOLECYSTECTOMY     TONSILLECTOMY     TUBAL LIGATION      OB History   No obstetric history on file.      Home Medications    Prior to Admission medications   Medication Sig Start Date End Date Taking? Authorizing Provider  albuterol (PROVENTIL HFA;VENTOLIN HFA) 108 (90 BASE) MCG/ACT inhaler Inhale 2  puffs into the lungs every 6 (six) hours as needed for wheezing.   Yes [provider]  amoxicillin-clavulanate (AUGMENTIN) 875-125 MG tablet Take 1 tablet by mouth every 12 (twelve) hours for 7 days. 03/31/21 04/07/21 Yes Laurene Footman B, PA-C  atorvastatin (LIPITOR) 10 MG tablet Take 1 tablet (10 mg total) by mouth daily. 11/08/12  Yes Einar Pheasant, MD  citalopram (CELEXA) 40 MG tablet Take 1.5 tablets (60 mg total) by mouth daily. 09/21/12  Yes Einar Pheasant, MD  cyclobenzaprine (FLEXERIL) 10 MG tablet Take 1 tablet (10 mg total) by mouth 3 (three) times daily as needed for muscle spasms. Do not drive while taking as can cause drowsiness 02/11/21  Yes Danton Clap, PA-C  Fluticasone-Salmeterol (ADVAIR) 250-50 MCG/DOSE AEPB Inhale into the lungs. 05/24/18  Yes [provider]  guaiFENesin-codeine (CHERATUSSIN AC) 100-10 MG/5ML syrup Take 10 mLs by mouth 3 (three) times daily as needed for cough. 03/31/21  Yes Laurene Footman B, PA-C  isosorbide mononitrate (IMDUR) 30 MG 24 hr tablet isosorbide mononitrate ER 30 mg tablet,extended release 24 hr   Yes [provider]  pantoprazole (PROTONIX) 40 MG tablet TAKE 1 TABLET TWICE A DAY 01/07/18  Yes [provider]  predniSONE (DELTASONE) 20 MG tablet Take 2 tablets (40 mg total) by mouth daily for 5 days. 03/31/21 04/05/21 Yes Laurene Footman B, PA-C  ramipril (ALTACE) 5 MG capsule Take by mouth. 10/10/19  Yes [provider]  SPIRIVA HANDIHALER 18 MCG inhalation capsule INHALE THE CONTENTS OF 1 CAPSULE DAILY 12/21/12  Yes Einar Pheasant, MD  SUMAtriptan (IMITREX) 25 MG tablet May repeat in 2 hours if headache persists or recurs. 02/11/21  Yes Danton Clap, PA-C  traZODone (DESYREL) 150 MG tablet TAKE 1 TABLET (150 MG TOTAL) AT BEDTIME 01/07/13  Yes Einar Pheasant, MD  esomeprazole (NEXIUM) 40 MG capsule Take 1 capsule (40 mg total) by mouth daily. 12/02/12 10/28/18  Einar Pheasant, MD  fluticasone (FLONASE) 50  MCG/ACT nasal spray Place 2 sprays into both nostrils daily. 07/15/15 10/28/18  Frederich Cha, MD  hydrochlorothiazide (HYDRODIURIL) 25 MG tablet Take 1 tablet (25 mg total) by mouth daily. 09/30/12 04/12/20  Einar Pheasant, MD    Family History Family History  Problem Relation Age of Onset   Breast cancer Maternal Aunt 46   Dementia Mother    Heart disease Mother    Diabetes Mother    Heart attack Father    AAA (abdominal aortic aneurysm) Father    Hypertension Father    Hyperlipidemia Father     Social History Social History   Tobacco Use   Smoking status: Former    Types: Cigarettes    Quit date: 06/08/2005    Years since quitting: 15.8   Smokeless tobacco: Never  Vaping Use   Vaping Use: Never used  Substance Use Topics   Alcohol use: No   Drug use: No     Allergies   Other   Review of Systems Review of Systems  Constitutional:  Positive for fatigue. Negative for appetite change, chills, diaphoresis and fever.  HENT:  Positive for congestion, rhinorrhea and sore throat. Negative for ear pain, sinus pressure and sinus pain.   Respiratory:  Positive for cough, shortness of breath and wheezing.   Cardiovascular:  Negative for chest pain.  Gastrointestinal:  Positive for abdominal pain. Negative for blood in stool, constipation, diarrhea, nausea and vomiting.  Genitourinary:  Negative for dysuria, flank pain, frequency and urgency.  Musculoskeletal:  Negative for arthralgias and myalgias.  Skin:  Negative for rash.  Neurological:  Negative for weakness and headaches.  Hematological:  Negative for adenopathy.    Physical Exam Triage Vital Signs ED Triage Vitals  Enc Vitals Group     BP 03/31/21 1005 114/72     Pulse Rate 03/31/21 1005 99     Resp 03/31/21 1005 18     Temp 03/31/21 1005 98.9 F (37.2 C)     Temp Source 03/31/21 1005 Oral     SpO2 03/31/21 1005 98 %     Weight 03/31/21 1004 178 lb (80.7 kg)     Height 03/31/21 1004 5\' 4"  (1.626 m)     Head  Circumference --      Peak Flow --      Pain Score 03/31/21 1003 9     Pain Loc --      Pain Edu? --      Excl. in [place]? --    No data found.  Updated Vital Signs BP 114/72 (BP Location: Left Arm)    Pulse 99    Temp 98.9 F (37.2 C) (Oral)    Resp 18    Ht 5\' 4"  (1.626 m)    Wt 178 lb (80.7 kg)    SpO2 98%    BMI 30.55 kg/m   Physical Exam Vitals and nursing note reviewed.  Constitutional:      General: She is not  in acute distress.    Appearance: Normal appearance. She is well-developed. She is ill-appearing. She is not toxic-appearing.  HENT:     Head: Normocephalic and atraumatic.     Nose: Congestion present.     Mouth/Throat:     Mouth: Mucous membranes are moist.     Pharynx: Oropharynx is clear.  Eyes:     General: No scleral icterus.       Right eye: No discharge.        Left eye: No discharge.     Conjunctiva/sclera: Conjunctivae normal.  Cardiovascular:     Rate and Rhythm: Normal rate and regular rhythm.     Heart sounds: Normal heart sounds.  Pulmonary:     Effort: Pulmonary effort is normal. No respiratory distress.     Breath sounds: Wheezing (diffuse wheezing throughout all lung fields) present.  Abdominal:     General: Bowel sounds are normal.     Palpations: Abdomen is soft.     Tenderness: There is abdominal tenderness (suprapubic>LLQ>RLQ). There is no right CVA tenderness, left CVA tenderness or guarding.  Musculoskeletal:     Cervical back: Neck supple.  Skin:    General: Skin is dry.  Neurological:     General: No focal deficit present.     Mental Status: She is alert. Mental status is at baseline.     Motor: No weakness.     Gait: Gait normal.  Psychiatric:        Mood and Affect: Mood normal.        Behavior: Behavior normal.        Thought Content: Thought content normal.     UC Treatments / Results  Labs (all labs ordered are listed, but only abnormal results are displayed) Labs Reviewed  URINALYSIS, COMPLETE (UACMP) WITH MICROSCOPIC  - Abnormal; Notable for the following components:      Result Value   Bilirubin Urine SMALL (*)    Ketones, ur TRACE (*)    Protein, ur 100 (*)    Bacteria, UA FEW (*)    All other components within normal limits    EKG   Radiology No results found.  Procedures Procedures (including critical care time)  Medications Ordered in UC Medications - No data to display  Initial Impression / Assessment and Plan / UC Course  I have reviewed the triage vital signs and the nursing notes.  Pertinent labs & imaging results that were available during my care of the patient were reviewed by me and considered in my medical decision making (see chart for details).   65 year old female returning for continued cough and congestion over the past 2 weeks.  Patient has COPD.  Seen in clinic a days ago and prescribed doxycycline, prednisone, Promethazine DM and benzonatate.  Chest x-ray was negative at that time.  No COVID or flu testing performed.  Reports no improvement or worsening of symptoms.   Vitals are all stable.  She is mildly ill-appearing but nontoxic.  Mild nasal congestion on exam.  Diffuse wheezing throughout all lung fields.  No respiratory distress or use of accessory muscles.  On exam she has soft abdomen but tenderness to palpation of the suprapubic region mostly followed by the left lower quadrant and then right lower quadrant.  No guarding or rebound.  No CVA tenderness.  Urinalysis obtained to assess for possible UTI causing her lower abdominal symptoms.  UA not consistent with UTI.  In the absence of fever, GI symptoms or GU symptoms, suspect  her abdominal pain is likely secondary to coughing.  Advised patient she likely has a viral illness and also the antibiotics did not help but we can try to treat her again with more antibiotics for COPD exacerbation and prednisone.  Also we will switch up her cough medicine.  Sent prednisone and Augmentin to pharmacy.  Sent Cheratussin to pharmacy  after reviewing controlled substance database.  Patient has had codeine containing cough medicine in the past.  Advised her to monitor and use carefully.  Advised to increase rest and fluids.  In regards to the abdominal pain, advised her to go to ED for any red flag signs or symptoms which I thoroughly discussed with her.  Otherwise, follow-up with PCP if not improving over the next week.   Final Clinical Impressions(s) / UC Diagnoses   Final diagnoses:  COPD exacerbation ([place])  Acute cough  Lower abdominal pain     Discharge Instructions      -Your urine test does not appear to be consistent with UTI. - Since you do not have fever, has normal vitals, have poorly localized abdominal pain and no other associated symptoms including no fever, nausea/vomiting/diarrhea/constipation or black or bloody stools or urinary symptoms, I suspect your abdominal pain is probably from coughing so much. -You have already been treated for COPD exacerbation last week but we can try something different this week.  I have sent more prednisone and a different antibiotics feels different cough medicine.  He does have codeine and is to be careful as it can make you sleepy. - If your abdominal pain worsens or is associated with any of those symptoms I mentioned above, you should be seen again immediately.     ED Prescriptions     Medication Sig Dispense Auth. Provider   predniSONE (DELTASONE) 20 MG tablet Take 2 tablets (40 mg total) by mouth daily for 5 days. 10 tablet Laurene Footman B, PA-C   amoxicillin-clavulanate (AUGMENTIN) 875-125 MG tablet Take 1 tablet by mouth every 12 (twelve) hours for 7 days. 14 tablet Laurene Footman B, PA-C   guaiFENesin-codeine (CHERATUSSIN AC) 100-10 MG/5ML syrup Take 10 mLs by mouth 3 (three) times daily as needed for cough. 118 mL Danton Clap, PA-C      PDMP not reviewed this encounter.   Danton Clap, PA-C 03/31/21 1149

## 2021-04-28 ENCOUNTER — Other Ambulatory Visit: Payer: Self-pay | Admitting: Orthopedic Surgery

## 2021-04-28 ENCOUNTER — Ambulatory Visit
Admission: RE | Admit: 2021-04-28 | Discharge: 2021-04-28 | Disposition: A | Discharge status: Home / Self Care | Payer: Medicare HMO | Source: Ambulatory Visit | Attending: Family Medicine | Admitting: Family Medicine

## 2021-04-28 ENCOUNTER — Other Ambulatory Visit: Payer: Self-pay

## 2021-04-28 DIAGNOSIS — M5412 Radiculopathy, cervical region: Secondary | ICD-10-CM

## 2021-04-28 DIAGNOSIS — Z1231 Encounter for screening mammogram for malignant neoplasm of breast: Secondary | ICD-10-CM | POA: Diagnosis not present

## 2021-04-28 DIAGNOSIS — M5030 Other cervical disc degeneration, unspecified cervical region: Secondary | ICD-10-CM

## 2021-05-10 ENCOUNTER — Ambulatory Visit: Payer: Self-pay

## 2021-05-18 ENCOUNTER — Ambulatory Visit
Admission: RE | Admit: 2021-05-18 | Discharge: 2021-05-18 | Disposition: A | Discharge status: Home / Self Care | Payer: Medicare HMO | Source: Ambulatory Visit | Attending: Orthopedic Surgery | Admitting: Orthopedic Surgery

## 2021-05-18 ENCOUNTER — Other Ambulatory Visit: Payer: Self-pay

## 2021-05-18 DIAGNOSIS — M5030 Other cervical disc degeneration, unspecified cervical region: Secondary | ICD-10-CM | POA: Insufficient documentation

## 2021-05-18 DIAGNOSIS — M5412 Radiculopathy, cervical region: Secondary | ICD-10-CM | POA: Diagnosis not present

## 2021-06-09 ENCOUNTER — Other Ambulatory Visit: Payer: Self-pay | Admitting: Family Medicine

## 2021-06-09 DIAGNOSIS — E041 Nontoxic single thyroid nodule: Secondary | ICD-10-CM

## 2021-06-23 ENCOUNTER — Ambulatory Visit
Admission: RE | Admit: 2021-06-23 | Discharge: 2021-06-23 | Disposition: A | Discharge status: Home / Self Care | Payer: Medicare HMO | Source: Ambulatory Visit | Attending: Family Medicine | Admitting: Family Medicine

## 2021-06-23 ENCOUNTER — Other Ambulatory Visit: Payer: Self-pay

## 2021-06-23 DIAGNOSIS — E041 Nontoxic single thyroid nodule: Secondary | ICD-10-CM

## 2021-07-20 ENCOUNTER — Encounter (HOSPITAL_COMMUNITY): Payer: Self-pay | Admitting: Radiology

## 2021-07-26 ENCOUNTER — Other Ambulatory Visit: Payer: Self-pay | Admitting: Orthopedic Surgery

## 2021-07-26 DIAGNOSIS — G8929 Other chronic pain: Secondary | ICD-10-CM

## 2021-07-26 DIAGNOSIS — M51369 Other intervertebral disc degeneration, lumbar region without mention of lumbar back pain or lower extremity pain: Secondary | ICD-10-CM

## 2021-07-26 DIAGNOSIS — M5136 Other intervertebral disc degeneration, lumbar region: Secondary | ICD-10-CM

## 2021-07-26 DIAGNOSIS — M4807 Spinal stenosis, lumbosacral region: Secondary | ICD-10-CM

## 2021-07-26 DIAGNOSIS — M5416 Radiculopathy, lumbar region: Secondary | ICD-10-CM

## 2021-07-30 ENCOUNTER — Ambulatory Visit
Admission: RE | Admit: 2021-07-30 | Discharge: 2021-07-30 | Disposition: A | Discharge status: Home / Self Care | Payer: Medicare HMO | Source: Ambulatory Visit | Attending: Orthopedic Surgery | Admitting: Orthopedic Surgery

## 2021-07-30 DIAGNOSIS — M5441 Lumbago with sciatica, right side: Secondary | ICD-10-CM | POA: Insufficient documentation

## 2021-07-30 DIAGNOSIS — M5136 Other intervertebral disc degeneration, lumbar region: Secondary | ICD-10-CM | POA: Diagnosis present

## 2021-07-30 DIAGNOSIS — M4807 Spinal stenosis, lumbosacral region: Secondary | ICD-10-CM

## 2021-07-30 DIAGNOSIS — G8929 Other chronic pain: Secondary | ICD-10-CM

## 2021-07-30 DIAGNOSIS — M5416 Radiculopathy, lumbar region: Secondary | ICD-10-CM | POA: Diagnosis present

## 2021-11-07 ENCOUNTER — Ambulatory Visit: Payer: Medicare HMO | Admitting: Nurse Practitioner

## 2021-11-08 ENCOUNTER — Ambulatory Visit
Admission: EM | Admit: 2021-11-08 | Discharge: 2021-11-08 | Disposition: A | Discharge status: Home / Self Care | Payer: Medicare HMO | Attending: Emergency Medicine | Admitting: Emergency Medicine

## 2021-11-08 DIAGNOSIS — D235 Other benign neoplasm of skin of trunk: Secondary | ICD-10-CM | POA: Diagnosis not present

## 2021-11-08 MED ORDER — MUPIROCIN 2 % EX OINT: 1.0000 | TOPICAL_OINTMENT | Freq: Two times a day (BID) | CUTANEOUS | 0 refills | Status: DC

## 2021-11-08 NOTE — Discharge Instructions (Signed)
Apply the Mupirocin ointment twice daily to the sore on your low back to help resolve the infection.  Call your dermatologist and tell them you have a dilated pore on your back and need an appointment.  You may continue to apply warm compresses as needed to help with drainage.

## 2021-11-08 NOTE — ED Provider Notes (Signed)
[facility]    CSN: [id number] Arrival date & time: 11/08/21  1803      History   Chief Complaint Chief Complaint  Patient presents with   Mass    HPI Veronica Kennedy is a 66 y.o. female.   HPI  66 year old female here for evaluation of skin complaint.  Patient reports that for the past 3 months she has been experiencing a swollen area in the middle of her low back.  2 to 3 days ago it began to become sore and red.  She states that she applied warm compresses today because she was unable to get anything to express from the area.  She reports that when the bump first was noticed she was able to express some tan pus from it.  She denies any fever.  Past Medical History:  Diagnosis Date   Asthma    COPD (chronic obstructive pulmonary disease) (HCC)    GERD (gastroesophageal reflux disease)    Hypercholesterolemia    Hypertension    Melanoma ([place])    approximately 8 years ago resected from Right lower leg.     Patient Active Problem List   Diagnosis Date Noted   GERD (gastroesophageal reflux disease) 09/21/2012   Stress 09/21/2012   Essential hypertension, benign 09/19/2012   Hypercholesterolemia 09/19/2012    Past Surgical History:  Procedure Laterality Date   CHOLECYSTECTOMY     TONSILLECTOMY     TUBAL LIGATION      OB History   No obstetric history on file.      Home Medications    Prior to Admission medications   Medication Sig Start Date End Date Taking? Authorizing Provider  albuterol (PROVENTIL HFA;VENTOLIN HFA) 108 (90 BASE) MCG/ACT inhaler Inhale 2 puffs into the lungs every 6 (six) hours as needed for wheezing.   Yes [provider]  atorvastatin (LIPITOR) 10 MG tablet Take 1 tablet (10 mg total) by mouth daily. 11/08/12  Yes Einar Pheasant, MD  citalopram (CELEXA) 40 MG tablet Take 1.5 tablets (60 mg total) by mouth daily. 09/21/12  Yes Einar Pheasant, MD  cyclobenzaprine (FLEXERIL) 10 MG tablet Take 1 tablet (10 mg total) by  mouth 3 (three) times daily as needed for muscle spasms. Do not drive while taking as can cause drowsiness 02/11/21  Yes Danton Clap, PA-C  Fluticasone-Salmeterol (ADVAIR) 250-50 MCG/DOSE AEPB Inhale into the lungs. 05/24/18  Yes [provider]  isosorbide mononitrate (IMDUR) 30 MG 24 hr tablet isosorbide mononitrate ER 30 mg tablet,extended release 24 hr   Yes [provider]  mupirocin ointment (BACTROBAN) 2 % Apply 1 Application topically 2 (two) times daily. 11/08/21  Yes Margarette Canada, NP  pantoprazole (PROTONIX) 40 MG tablet TAKE 1 TABLET TWICE A DAY 01/07/18  Yes [provider]  ramipril (ALTACE) 5 MG capsule Take by mouth. 10/10/19  Yes [provider]  SPIRIVA HANDIHALER 18 MCG inhalation capsule INHALE THE CONTENTS OF 1 CAPSULE DAILY 12/21/12  Yes Einar Pheasant, MD  SUMAtriptan (IMITREX) 25 MG tablet May repeat in 2 hours if headache persists or recurs. 02/11/21  Yes Danton Clap, PA-C  traZODone (DESYREL) 150 MG tablet TAKE 1 TABLET (150 MG TOTAL) AT BEDTIME 01/07/13  Yes Einar Pheasant, MD  esomeprazole (NEXIUM) 40 MG capsule Take 1 capsule (40 mg total) by mouth daily. 12/02/12 10/28/18  Einar Pheasant, MD  fluticasone (FLONASE) 50 MCG/ACT nasal spray Place 2 sprays into both nostrils daily. 07/15/15 10/28/18  Frederich Cha, MD  hydrochlorothiazide (HYDRODIURIL)  25 MG tablet Take 1 tablet (25 mg total) by mouth daily. 09/30/12 04/12/20  Einar Pheasant, MD    Family History Family History  Problem Relation Age of Onset   Breast cancer Maternal Aunt 50   Dementia Mother    Heart disease Mother    Diabetes Mother    Heart attack Father    AAA (abdominal aortic aneurysm) Father    Hypertension Father    Hyperlipidemia Father     Social History Social History   Tobacco Use   Smoking status: Former    Types: Cigarettes    Quit date: 06/08/2005    Years since quitting: 16.4   Smokeless tobacco: Never  Vaping Use   Vaping Use: Never used   Substance Use Topics   Alcohol use: No   Drug use: No     Allergies   Other   Review of Systems Review of Systems  Constitutional:  Negative for fever.  Skin:  Positive for color change and wound.  Hematological: Negative.   Psychiatric/Behavioral: Negative.       Physical Exam Triage Vital Signs ED Triage Vitals  Enc Vitals Group     BP 11/08/21 1821 133/82     Pulse Rate 11/08/21 1821 90     Resp 11/08/21 1821 18     Temp 11/08/21 1821 98 F (36.7 C)     Temp Source 11/08/21 1821 Oral     SpO2 11/08/21 1821 95 %     Weight 11/08/21 1820 180 lb (81.6 kg)     Height 11/08/21 1820 '5\' 4"'$  (1.626 m)     Head Circumference --      Peak Flow --      Pain Score 11/08/21 1820 4     Pain Loc --      Pain Edu? --      Excl. in [place]? --    No data found.  Updated Vital Signs BP 133/82 (BP Location: Left Arm)   Pulse 90   Temp 98 F (36.7 C) (Oral)   Resp 18   Ht '5\' 4"'$  (1.626 m)   Wt 180 lb (81.6 kg)   SpO2 95%   BMI 30.90 kg/m   Visual Acuity Right Eye Distance:   Left Eye Distance:   Bilateral Distance:    Right Eye Near:   Left Eye Near:    Bilateral Near:     Physical Exam Vitals and nursing note reviewed.  Constitutional:      Appearance: Normal appearance.  Skin:    General: Skin is warm and dry.     Capillary Refill: Capillary refill takes less than 2 seconds.     Findings: Erythema and lesion present.  Neurological:     General: No focal deficit present.     Mental Status: She is alert and oriented to person, place, and time.  Psychiatric:        Mood and Affect: Mood normal.        Behavior: Behavior normal.        Thought Content: Thought content normal.        Judgment: Judgment normal.      UC Treatments / Results  Labs (all labs ordered are listed, but only abnormal results are displayed) Labs Reviewed - No data to display  EKG   Radiology No results found.  Procedures Procedures (including critical care  time)  Medications Ordered in UC Medications - No data to display  Initial Impression / Assessment and Plan /  UC Course  I have reviewed the triage vital signs and the nursing notes.  Pertinent labs & imaging results that were available during my care of the patient were reviewed by me and considered in my medical decision making (see chart for details).  Patient is a very pleasant, nontoxic-appearing 66 year old female here for evaluation of a lesion in the middle of her low back that is been present for last 2 to 3 months and has become more sore and red in the past 2 to 3 days.  This not associated with fever.  Patient does report that initially when she first noticed a lesion she was able to express some tan pus from it but that stopped and she has not had any more drainage from the wound.  This morning she attempted to try and express fluid from the area because of the discomfort but was unable to.  She applied warm compresses to the area and attempt to provide some relief.  On exam patient has a pencil head eraser size area of erythema with a yellow slough center.  The area is not particularly swollen or hot to touch.  It is mildly tender.  When palpating the borders of the lesion some thick tan discharge was expressed from the lesion.  Once the discharge was removed it became evident that the patient has a dilated pore of winer in the middle of her low back.  I am concerned with the erythema and that it has become infected.  I will prescribe mupirocin ointment that she can apply twice daily to the lesion to help resolve the infection.  She has a dermatologist and I have encouraged her to consult her dermatologist in the morning to get an appointment to have the lesion excised.  She can continue to apply warm compresses to keep the skin supple and prevent reaccumulation of sebum.   Final Clinical Impressions(s) / UC Diagnoses   Final diagnoses:  Dilated pore of Winer of back     Discharge  Instructions      Apply the Mupirocin ointment twice daily to the sore on your low back to help resolve the infection.  Call your dermatologist and tell them you have a dilated pore on your back and need an appointment.  You may continue to apply warm compresses as needed to help with drainage.     ED Prescriptions     Medication Sig Dispense Auth. Provider   mupirocin ointment (BACTROBAN) 2 % Apply 1 Application topically 2 (two) times daily. 22 g Margarette Canada, NP      PDMP not reviewed this encounter.   Margarette Canada, NP 11/08/21 [phone]

## 2021-11-08 NOTE — ED Triage Notes (Signed)
Pt c/o boil along lower back x71month.  Pt states that it recently started to hurt and turn red.

## 2021-11-09 ENCOUNTER — Ambulatory Visit: Payer: Self-pay

## 2021-12-02 ENCOUNTER — Other Ambulatory Visit: Payer: Self-pay

## 2021-12-02 DIAGNOSIS — Z01818 Encounter for other preprocedural examination: Secondary | ICD-10-CM

## 2021-12-02 NOTE — Telephone Encounter (Signed)
She called back and chose date 12/26/2021 for surgery. All her postop appts have been scheduled.

## 2021-12-14 ENCOUNTER — Encounter
Admission: RE | Admit: 2021-12-14 | Discharge: 2021-12-14 | Disposition: A | Discharge status: Home / Self Care | Payer: Medicare HMO | Source: Ambulatory Visit | Attending: Neurosurgery | Admitting: Neurosurgery

## 2021-12-14 VITALS — BP 113/88 | HR 85 | Resp 16 | Ht 64.0 in | Wt 184.5 lb

## 2021-12-14 DIAGNOSIS — I10 Essential (primary) hypertension: Secondary | ICD-10-CM | POA: Insufficient documentation

## 2021-12-14 DIAGNOSIS — J449 Chronic obstructive pulmonary disease, unspecified: Secondary | ICD-10-CM

## 2021-12-14 DIAGNOSIS — Z01818 Encounter for other preprocedural examination: Secondary | ICD-10-CM | POA: Diagnosis present

## 2021-12-14 DIAGNOSIS — Z01810 Encounter for preprocedural cardiovascular examination: Secondary | ICD-10-CM

## 2021-12-14 HISTORY — DX: Unspecified osteoarthritis, unspecified site: M19.90

## 2021-12-14 HISTORY — DX: Dyspnea, unspecified: R06.00

## 2021-12-14 HISTORY — DX: Nontoxic multinodular goiter: E04.2

## 2021-12-14 LAB — URINALYSIS, ROUTINE W REFLEX MICROSCOPIC
Bilirubin Urine: NEGATIVE
Glucose, UA: NEGATIVE mg/dL
Hgb urine dipstick: NEGATIVE
Ketones, ur: NEGATIVE mg/dL
Leukocytes,Ua: NEGATIVE
Nitrite: NEGATIVE
Protein, ur: NEGATIVE mg/dL
Specific Gravity, Urine: 1.014 (ref 1.005–1.030)
pH: 7 (ref 5.0–8.0)

## 2021-12-14 LAB — CBC
HCT: 39.8 % (ref 36.0–46.0)
Hemoglobin: 13.2 g/dL (ref 12.0–15.0)
MCH: 31.4 pg (ref 26.0–34.0)
MCHC: 33.2 g/dL (ref 30.0–36.0)
MCV: 94.8 fL (ref 80.0–100.0)
Platelets: 252 10*3/uL (ref 150–400)
RBC: 4.2 MIL/uL (ref 3.87–5.11)
RDW: 11.6 % (ref 11.5–15.5)
WBC: 6 10*3/uL (ref 4.0–10.5)
nRBC: 0 % (ref 0.0–0.2)

## 2021-12-14 LAB — SURGICAL PCR SCREEN
MRSA, PCR: NEGATIVE
Staphylococcus aureus: NEGATIVE

## 2021-12-14 LAB — BASIC METABOLIC PANEL
Anion gap: 8 (ref 5–15)
BUN: 26 mg/dL — ABNORMAL HIGH (ref 8–23)
CO2: 30 mmol/L (ref 22–32)
Calcium: 9 mg/dL (ref 8.9–10.3)
Chloride: 103 mmol/L (ref 98–111)
Creatinine, Ser: 0.96 mg/dL (ref 0.44–1.00)
GFR, Estimated: >60 mL/min (ref >60)
Glucose, Bld: 84 mg/dL (ref 70–99)
Potassium: 4 mmol/L (ref 3.5–5.1)
Sodium: 141 mmol/L (ref 135–145)

## 2021-12-14 LAB — TYPE AND SCREEN
ABO/RH(D): A POS
Antibody Screen: NEGATIVE

## 2021-12-14 NOTE — Patient Instructions (Addendum)
Your procedure is scheduled on:12-26-21 Monday Report to the Registration Desk on the 1st floor of the [facility].Then proceed to the 2nd floor Surgery Desk To find out your arrival time, please call [phone] between 1PM - 3PM on:12-23-21 Friday If your arrival time is 6:00 am, do not arrive prior to that time as the [facility] entrance doors do not open until 6:00 am.  REMEMBER: Instructions that are not followed completely may result in serious medical risk, up to and including death; or upon the discretion of your surgeon and anesthesiologist your surgery may need to be rescheduled.  Do not eat food after midnight the night before surgery.  No gum chewing, lozengers or hard candies.  You may however, drink CLEAR liquids up to 2 hours before you are scheduled to arrive for your surgery. Do not drink anything within 2 hours of your scheduled arrival time.  Clear liquids include: - water  - apple juice without pulp - gatorade (not RED colors) - black coffee or tea (Do NOT add milk or creamers to the coffee or tea) Do NOT drink anything that is not on this list.  TAKE THESE MEDICATIONS THE MORNING OF SURGERY WITH A SIP OF WATER: -atorvastatin (LIPITOR) -citalopram (CELEXA) -pantoprazole (PROTONIX)   Use your Advair, Incruse Ellipta and your Albuterol Inhaler the day of surgery and bring your Albuterol Inhaler to the hospital  One week prior to surgery: Stop Anti-inflammatories (NSAIDS) such as Advil, Aleve, Ibuprofen, Motrin, Naproxen, Naprosyn and Aspirin based products such as Excedrin, Goodys Powder, BC Powder.You may however, continue to take Tylenol/Tramadol if needed for pain up until the day of surgery.  Stop ANY OVER THE COUNTER supplements/vitamins 7 days prior to surgery (Calcium-Vitamin D)  No Alcohol for 24 hours before or after surgery.  No Smoking including e-cigarettes for 24 hours prior to surgery.  No chewable tobacco products for at least 6 hours prior to  surgery.  No nicotine patches on the day of surgery.  Do not use any "recreational" drugs for at least a week prior to your surgery.  Please be advised that the combination of cocaine and anesthesia may have negative outcomes, up to and including death. If you test positive for cocaine, your surgery will be cancelled.  On the morning of surgery brush your teeth with toothpaste and water, you may rinse your mouth with mouthwash if you wish. Do not swallow any toothpaste or mouthwash.  Use CHG Soap  as directed on instruction sheet.  Do not wear jewelry, make-up, hairpins, clips or nail polish.  Do not wear lotions, powders, or perfumes.   Do not shave body from the neck down 48 hours prior to surgery just in case you cut yourself which could leave a site for infection.  Also, freshly shaved skin may become irritated if using the CHG soap.  Contact lenses, hearing aids and dentures may not be worn into surgery.  Do not bring valuables to the hospital. [facility] is not responsible for any missing/lost belongings or valuables.   Notify your doctor if there is any change in your medical condition (cold, fever, infection).  Wear comfortable clothing (specific to your surgery type) to the hospital.  After surgery, you can help prevent lung complications by doing breathing exercises.  Take deep breaths and cough every 1-2 hours. Your doctor may order a device called an Incentive Spirometer to help you take deep breaths. When coughing or sneezing, hold a pillow firmly against your incision with both  hands. This is called "splinting." Doing this helps protect your incision. It also decreases belly discomfort.  If you are being admitted to the hospital overnight, leave your suitcase in the car. After surgery it may be brought to your room.  If you are being discharged the day of surgery, you will not be allowed to drive home. You will need a responsible adult (18 years or older) to drive  you home and stay with you that night.   If you are taking public transportation, you will need to have a responsible adult (18 years or older) with you. Please confirm with your physician that it is acceptable to use public transportation.   Please call the [facility] Dept. at [phone] if you have any questions about these instructions.  Surgery Visitation Policy:  Patients undergoing a surgery or procedure may have two family members or support persons with them as long as the person is not COVID-19 positive or experiencing its symptoms.

## 2021-12-26 ENCOUNTER — Other Ambulatory Visit: Payer: Self-pay

## 2021-12-26 ENCOUNTER — Encounter: Payer: Self-pay | Admitting: Neurosurgery

## 2021-12-26 ENCOUNTER — Ambulatory Visit: Payer: Medicare HMO | Admitting: Anesthesiology

## 2021-12-26 ENCOUNTER — Encounter
Admission: RE | Disposition: A | Discharge status: Home / Self Care | Payer: Self-pay | Source: Home / Self Care | Attending: Neurosurgery

## 2021-12-26 ENCOUNTER — Ambulatory Visit: Payer: Medicare HMO | Admitting: Urgent Care

## 2021-12-26 ENCOUNTER — Ambulatory Visit
Admission: RE | Admit: 2021-12-26 | Discharge: 2021-12-26 | Disposition: A | Discharge status: Home / Self Care | Payer: Medicare HMO | Attending: Neurosurgery | Admitting: Neurosurgery

## 2021-12-26 ENCOUNTER — Ambulatory Visit: Payer: Medicare HMO

## 2021-12-26 DIAGNOSIS — Z7951 Long term (current) use of inhaled steroids: Secondary | ICD-10-CM | POA: Insufficient documentation

## 2021-12-26 DIAGNOSIS — K219 Gastro-esophageal reflux disease without esophagitis: Secondary | ICD-10-CM | POA: Diagnosis not present

## 2021-12-26 DIAGNOSIS — J449 Chronic obstructive pulmonary disease, unspecified: Secondary | ICD-10-CM | POA: Diagnosis not present

## 2021-12-26 DIAGNOSIS — M1990 Unspecified osteoarthritis, unspecified site: Secondary | ICD-10-CM | POA: Insufficient documentation

## 2021-12-26 DIAGNOSIS — I10 Essential (primary) hypertension: Secondary | ICD-10-CM | POA: Diagnosis not present

## 2021-12-26 DIAGNOSIS — M5416 Radiculopathy, lumbar region: Secondary | ICD-10-CM

## 2021-12-26 DIAGNOSIS — Z87891 Personal history of nicotine dependence: Secondary | ICD-10-CM | POA: Insufficient documentation

## 2021-12-26 DIAGNOSIS — Z79899 Other long term (current) drug therapy: Secondary | ICD-10-CM | POA: Diagnosis not present

## 2021-12-26 DIAGNOSIS — M4726 Other spondylosis with radiculopathy, lumbar region: Secondary | ICD-10-CM | POA: Diagnosis not present

## 2021-12-26 DIAGNOSIS — Z01818 Encounter for other preprocedural examination: Secondary | ICD-10-CM

## 2021-12-26 HISTORY — PX: LUMBAR LAMINECTOMY/DECOMPRESSION MICRODISCECTOMY: SHX5026

## 2021-12-26 LAB — ABO/RH: ABO/RH(D): A POS

## 2021-12-26 SURGERY — LUMBAR LAMINECTOMY/DECOMPRESSION MICRODISCECTOMY 1 LEVEL
Anesthesia: General | Site: Spine Lumbar | Laterality: Right

## 2021-12-26 MED ORDER — EPHEDRINE 5 MG/ML INJ
INTRAVENOUS | Status: AC
  Filled 2021-12-26: qty 5

## 2021-12-26 MED ORDER — MIDAZOLAM HCL 2 MG/2ML IJ SOLN
INTRAMUSCULAR | Status: DC | PRN
  Administered 2021-12-26: 2 mg via INTRAVENOUS

## 2021-12-26 MED ORDER — PHENYLEPHRINE HCL-NACL 20-0.9 MG/250ML-% IV SOLN
INTRAVENOUS | Status: DC | PRN
  Administered 2021-12-26: 50 ug/min via INTRAVENOUS

## 2021-12-26 MED ORDER — DEXMEDETOMIDINE HCL IN NACL 200 MCG/50ML IV SOLN
INTRAVENOUS | Status: DC | PRN
  Administered 2021-12-26: 12 ug via INTRAVENOUS

## 2021-12-26 MED ORDER — LACTATED RINGERS IV SOLN: INTRAVENOUS | Status: DC

## 2021-12-26 MED ORDER — ONDANSETRON HCL 4 MG/2ML IJ SOLN
INTRAMUSCULAR | Status: AC
  Filled 2021-12-26: qty 2

## 2021-12-26 MED ORDER — SURGIFLO WITH THROMBIN (HEMOSTATIC MATRIX KIT) OPTIME
TOPICAL | Status: DC | PRN
  Administered 2021-12-26: 1 via TOPICAL

## 2021-12-26 MED ORDER — DEXAMETHASONE SODIUM PHOSPHATE 10 MG/ML IJ SOLN
INTRAMUSCULAR | Status: AC
  Filled 2021-12-26: qty 1

## 2021-12-26 MED ORDER — EPHEDRINE SULFATE (PRESSORS) 50 MG/ML IJ SOLN
INTRAMUSCULAR | Status: DC | PRN
  Administered 2021-12-26: 5 mg via INTRAVENOUS
  Administered 2021-12-26 (×2): 10 mg via INTRAVENOUS

## 2021-12-26 MED ORDER — BUPIVACAINE LIPOSOME 1.3 % IJ SUSP
INTRAMUSCULAR | Status: AC
Start: 2021-12-26 — End: ?
  Filled 2021-12-26: qty 20

## 2021-12-26 MED ORDER — PROPOFOL 10 MG/ML IV BOLUS
INTRAVENOUS | Status: DC | PRN
  Administered 2021-12-26: 50 mg via INTRAVENOUS
  Administered 2021-12-26: 150 mg via INTRAVENOUS

## 2021-12-26 MED ORDER — PROPOFOL 1000 MG/100ML IV EMUL
INTRAVENOUS | Status: AC
  Filled 2021-12-26: qty 100

## 2021-12-26 MED ORDER — OXYCODONE HCL 5 MG PO TABS
5.0000 mg | ORAL_TABLET | Freq: Once | ORAL | Status: AC | PRN
  Administered 2021-12-26: 5 mg via ORAL

## 2021-12-26 MED ORDER — OXYCODONE HCL 5 MG/5ML PO SOLN: 5.0000 mg | Freq: Once | ORAL | Status: AC | PRN

## 2021-12-26 MED ORDER — BUPIVACAINE-EPINEPHRINE (PF) 0.5% -1:200000 IJ SOLN
INTRAMUSCULAR | Status: AC
  Filled 2021-12-26: qty 30

## 2021-12-26 MED ORDER — 0.9 % SODIUM CHLORIDE (POUR BTL) OPTIME
TOPICAL | Status: DC | PRN
  Administered 2021-12-26: 500 mL

## 2021-12-26 MED ORDER — METHYLPREDNISOLONE ACETATE 40 MG/ML IJ SUSP
INTRAMUSCULAR | Status: DC | PRN
  Administered 2021-12-26: 40 mg

## 2021-12-26 MED ORDER — ORAL CARE MOUTH RINSE: 15.0000 mL | Freq: Once | OROMUCOSAL | Status: AC

## 2021-12-26 MED ORDER — SODIUM CHLORIDE (PF) 0.9 % IJ SOLN
INTRAMUSCULAR | Status: DC | PRN
  Administered 2021-12-26: 60 mL via INTRAMUSCULAR

## 2021-12-26 MED ORDER — ONDANSETRON HCL 4 MG/2ML IJ SOLN: 4.0000 mg | Freq: Once | INTRAMUSCULAR | Status: DC | PRN

## 2021-12-26 MED ORDER — ACETAMINOPHEN 10 MG/ML IV SOLN: 1000.0000 mg | Freq: Once | INTRAVENOUS | Status: DC | PRN

## 2021-12-26 MED ORDER — FENTANYL CITRATE (PF) 250 MCG/5ML IJ SOLN
INTRAMUSCULAR | Status: AC
  Filled 2021-12-26: qty 5

## 2021-12-26 MED ORDER — SODIUM CHLORIDE FLUSH 0.9 % IV SOLN
INTRAVENOUS | Status: AC
  Filled 2021-12-26: qty 20

## 2021-12-26 MED ORDER — LIDOCAINE HCL (PF) 2 % IJ SOLN
INTRAMUSCULAR | Status: AC
  Filled 2021-12-26: qty 5

## 2021-12-26 MED ORDER — BUPIVACAINE HCL (PF) 0.5 % IJ SOLN
INTRAMUSCULAR | Status: AC
Start: 2021-12-26 — End: ?
  Filled 2021-12-26: qty 30

## 2021-12-26 MED ORDER — CEFAZOLIN SODIUM-DEXTROSE 2-4 GM/100ML-% IV SOLN
INTRAVENOUS | Status: AC
  Filled 2021-12-26: qty 100

## 2021-12-26 MED ORDER — DEXAMETHASONE SODIUM PHOSPHATE 10 MG/ML IJ SOLN
INTRAMUSCULAR | Status: DC | PRN
  Administered 2021-12-26: 10 mg via INTRAVENOUS

## 2021-12-26 MED ORDER — CHLORHEXIDINE GLUCONATE 0.12 % MT SOLN
OROMUCOSAL | Status: AC
  Administered 2021-12-26: 15 mL via OROMUCOSAL
  Filled 2021-12-26: qty 15

## 2021-12-26 MED ORDER — BUPIVACAINE-EPINEPHRINE (PF) 0.5% -1:200000 IJ SOLN
INTRAMUSCULAR | Status: DC | PRN
  Administered 2021-12-26: 8 mL

## 2021-12-26 MED ORDER — METHYLPREDNISOLONE ACETATE 40 MG/ML IJ SUSP
INTRAMUSCULAR | Status: AC
Start: 2021-12-26 — End: ?
  Filled 2021-12-26: qty 1

## 2021-12-26 MED ORDER — ONDANSETRON HCL 4 MG/2ML IJ SOLN
INTRAMUSCULAR | Status: DC | PRN
  Administered 2021-12-26: 4 mg via INTRAVENOUS

## 2021-12-26 MED ORDER — FENTANYL CITRATE (PF) 100 MCG/2ML IJ SOLN
INTRAMUSCULAR | Status: DC | PRN
Start: 2021-12-26 — End: 2021-12-26
  Administered 2021-12-26: 50 ug via INTRAVENOUS
  Administered 2021-12-26: 100 ug via INTRAVENOUS
  Administered 2021-12-26: 50 ug via INTRAVENOUS

## 2021-12-26 MED ORDER — LIDOCAINE HCL (CARDIAC) PF 100 MG/5ML IV SOSY
PREFILLED_SYRINGE | INTRAVENOUS | Status: DC | PRN
  Administered 2021-12-26: 100 mg via INTRAVENOUS

## 2021-12-26 MED ORDER — MIDAZOLAM HCL 2 MG/2ML IJ SOLN
INTRAMUSCULAR | Status: AC
  Filled 2021-12-26: qty 2

## 2021-12-26 MED ORDER — PROPOFOL 500 MG/50ML IV EMUL
INTRAVENOUS | Status: DC | PRN
  Administered 2021-12-26: 100 ug/kg/min via INTRAVENOUS

## 2021-12-26 MED ORDER — CEFAZOLIN SODIUM-DEXTROSE 2-4 GM/100ML-% IV SOLN
2.0000 g | Freq: Once | INTRAVENOUS | Status: AC
  Administered 2021-12-26: 2 g via INTRAVENOUS
  Filled 2021-12-26: qty 100

## 2021-12-26 MED ORDER — CHLORHEXIDINE GLUCONATE 0.12 % MT SOLN: 15.0000 mL | Freq: Once | OROMUCOSAL | Status: AC

## 2021-12-26 MED ORDER — SUCCINYLCHOLINE CHLORIDE 200 MG/10ML IV SOSY
PREFILLED_SYRINGE | INTRAVENOUS | Status: DC | PRN
  Administered 2021-12-26: 120 mg via INTRAVENOUS

## 2021-12-26 MED ORDER — SUCCINYLCHOLINE CHLORIDE 200 MG/10ML IV SOSY
PREFILLED_SYRINGE | INTRAVENOUS | Status: AC
  Filled 2021-12-26: qty 10

## 2021-12-26 MED ORDER — FENTANYL CITRATE (PF) 100 MCG/2ML IJ SOLN: 25.0000 ug | INTRAMUSCULAR | Status: DC | PRN

## 2021-12-26 MED ORDER — HYDROCODONE-ACETAMINOPHEN 5-325 MG PO TABS: 1.0000 | ORAL_TABLET | Freq: Four times a day (QID) | ORAL | 0 refills | Status: DC | PRN

## 2021-12-26 MED ORDER — OXYCODONE HCL 5 MG PO TABS
ORAL_TABLET | ORAL | Status: AC
  Filled 2021-12-26: qty 1

## 2021-12-26 SURGICAL SUPPLY — 49 items
ADH SKN CLS APL DERMABOND .7 (GAUZE/BANDAGES/DRESSINGS) ×1
AGENT HMST KT MTR STRL THRMB (HEMOSTASIS) ×1
APL PRP STRL LF DISP 70% ISPRP (MISCELLANEOUS) ×2
BASIN KIT SINGLE STR (MISCELLANEOUS) ×1 IMPLANT
BUR NEURO DRILL SOFT 3.0X3.8M (BURR) ×1 IMPLANT
CHLORAPREP W/TINT 26 (MISCELLANEOUS) ×1 IMPLANT
CNTNR SPEC 2.5X3XGRAD LEK (MISCELLANEOUS) ×1
CONT SPEC 4OZ STER OR WHT (MISCELLANEOUS) ×1
CONT SPEC 4OZ STRL OR WHT (MISCELLANEOUS) ×1
CONTAINER SPEC 2.5X3XGRAD LEK (MISCELLANEOUS) ×1 IMPLANT
DERMABOND ADVANCE IMPLANT
DERMABOND ADVANCED .7 DNX12 (GAUZE/BANDAGES/DRESSINGS) ×1 IMPLANT
DRAPE C ARM PK CFD 31 SPINE (DRAPES) ×1 IMPLANT
DRAPE LAPAROTOMY 100X77 ABD (DRAPES) ×1 IMPLANT
DRAPE MICROSCOPE SPINE 48X150 (DRAPES) ×1 IMPLANT
DRAPE SURG 17X11 SM STRL (DRAPES) ×1 IMPLANT
DRSG OPSITE POSTOP 3X4 (GAUZE/BANDAGES/DRESSINGS) IMPLANT
ELECT EZSTD 165MM 6.5IN (MISCELLANEOUS)
ELECT REM PT RETURN 9FT ADLT (ELECTROSURGICAL) ×1
ELECTRODE EZSTD 165MM 6.5IN (MISCELLANEOUS) IMPLANT
ELECTRODE REM PT RTRN 9FT ADLT (ELECTROSURGICAL) ×1 IMPLANT
GLOVE BIOGEL PI IND STRL 6.5 (GLOVE) ×1 IMPLANT
GLOVE BIOGEL PI IND STRL 8.5 (GLOVE) ×1 IMPLANT
GLOVE SURG SYN 6.5 ES PF (GLOVE) ×2 IMPLANT
GLOVE SURG SYN 6.5 PF PI (GLOVE) ×2 IMPLANT
GLOVE SURG SYN 8.5  E (GLOVE) ×3
GLOVE SURG SYN 8.5 E (GLOVE) ×3 IMPLANT
GLOVE SURG SYN 8.5 PF PI (GLOVE) ×3 IMPLANT
GOWN SRG LRG LVL 4 IMPRV REINF (GOWNS) ×1 IMPLANT
GOWN SRG XL LVL 3 NONREINFORCE (GOWNS) ×1 IMPLANT
GOWN STRL NON-REIN TWL XL LVL3 (GOWNS) ×1
GOWN STRL REIN LRG LVL4 (GOWNS) ×1
KIT SPINAL PRONEVIEW (KITS) ×1 IMPLANT
MANIFOLD NEPTUNE II (INSTRUMENTS) ×1 IMPLANT
MARKER SKIN DUAL TIP RULER LAB (MISCELLANEOUS) ×1 IMPLANT
NDL SAFETY ECLIP 18X1.5 (MISCELLANEOUS) ×1 IMPLANT
NS IRRIG 500ML POUR BTL (IV SOLUTION) IMPLANT
PACK LAMINECTOMY NEURO (CUSTOM PROCEDURE TRAY) ×1 IMPLANT
PAD ARMBOARD 7.5X6 YLW CONV (MISCELLANEOUS) ×1 IMPLANT
SURGIFLO W/THROMBIN 8M KIT (HEMOSTASIS) ×1 IMPLANT
SUT DVC VLOC 3-0 CL 6 P-12 (SUTURE) ×1 IMPLANT
SUT VIC AB 0 CT1 27 (SUTURE) ×1
SUT VIC AB 0 CT1 27XCR 8 STRN (SUTURE) ×1 IMPLANT
SUT VIC AB 2-0 CT1 18 (SUTURE) ×1 IMPLANT
SYR 10ML LL (SYRINGE) ×2 IMPLANT
SYR 30ML LL (SYRINGE) ×2 IMPLANT
SYR 3ML LL SCALE MARK (SYRINGE) ×1 IMPLANT
TRAP FLUID SMOKE EVACUATOR (MISCELLANEOUS) ×1 IMPLANT
WATER STERILE IRR 1000ML POUR (IV SOLUTION) ×2 IMPLANT

## 2021-12-26 NOTE — Op Note (Addendum)
Indications: Veronica Kennedy is a87 yo female who presented with lumbar radiculopathy.  She failed conservative management prompting surgical intervention.  Findings: lateral recess compression  Preoperative Diagnosis: Lumbar radiculopathy Postoperative Diagnosis: same   EBL: 10 ml IVF: see AR ml Drains: none Disposition: Extubated and Stable to PACU Complications: none  No foley catheter was placed.   Preoperative Note:   Risks of surgery discussed include: infection, bleeding, stroke, coma, death, paralysis, CSF leak, nerve/spinal cord injury, numbness, tingling, weakness, complex regional pain syndrome, recurrent stenosis and/or disc herniation, vascular injury, development of instability, neck/back pain, need for further surgery, persistent symptoms, development of deformity, and the risks of anesthesia. The patient understood these risks and agreed to proceed.  Operative Note:   1. Right L4-5 laminoforaminotomy  The patient was then brought from the preoperative center with intravenous access established.  The patient underwent general anesthesia and endotracheal tube intubation, and was then rotated on the [place] rail top where all pressure points were appropriately padded.  The skin was then thoroughly cleansed.  Perioperative antibiotic prophylaxis was administered.  Sterile prep and drapes were then applied and a timeout was then observed.  C-arm was brought into the field under sterile conditions and under lateral visualization the L4-5 interspace was identified and marked.  The incision was marked on the right and injected with local anesthetic. Once this was complete a 3 cm incision was opened with the use of a #10 blade knife.    The metrx tubes were sequentially advanced and confirmed in position at L4-5. An 21m by 834mtube was locked in place to the bed side attachment.  The microscope was then sterilely brought into the field and muscle creep was hemostased with a bipolar  and resected with a pituitary rongeur.  A Bovie extender was then used to expose the spinous process and lamina.  Careful attention was placed to not violate the facet capsule. A 3 mm matchstick drill bit was then used to make a hemi-laminotomy trough until the ligamentum flavum was exposed.  This was extended to the base of the spinous process and to the contralateral side to remove all the central bone from each side.  Once this was complete and the underlying ligamentum flavum was visualized, it was dissected with a curette and resected with Kerrison rongeurs.  Extensive ligamentum hypertrophy was noted, requiring a substantial amount of time and care for removal.  The dura was identified and palpated. The kerrison rongeur was then used to remove the medial facet bilaterally until no compression was noted.  A balltip probe was used to confirm decompression of the ipsilateral L5 nerve root.  No CSF leak was noted.  A Depo-Medrol soaked Gelfoam pledget was placed in the defect.  The wound was copiously irrigated. The tube system was then removed under microscopic visualization and hemostasis was obtained with a bipolar.    The fascial layer was reapproximated with the use of a 0 Vicryl suture.  Subcutaneous tissue layer was reapproximated using 2-0 Vicryl suture.  3-0 monocryl was placed in subcuticular fashion. The skin was then cleansed and Dermabond was used to close the skin opening.  Patient was then rotated back to the preoperative bed awakened from anesthesia and taken to recovery all counts are correct in this case.  I performed the entire procedure with the assistance of Veronica Kennedy as an asPensions consultantn assistant was required for this procedure due to the complexity.  The assistant provided assistance in tissue manipulation and suction,  and was required for the successful and safe performance of the procedure. I performed the critical portions of the procedure.   Veronica Mcray K. Izora Ribas  MD

## 2021-12-26 NOTE — Transfer of Care (Signed)
Immediate Anesthesia Transfer of Care Note  Patient: Veronica Kennedy  Procedure(s) Performed: RIGHT L4-5 LAMINOFORAMINOTOMY (Right: Spine Lumbar)  Patient Location: PACU  Anesthesia Type:General  Level of Consciousness: drowsy and patient cooperative  Airway & Oxygen Therapy: Patient Spontanous Breathing and Patient connected to nasal cannula oxygen  Post-op Assessment: Report given to RN and Post -op Vital signs reviewed and stable  Post vital signs: Reviewed and stable  Last Vitals:  Vitals Value Taken Time  BP 142/95 12/26/21 1156  Temp 36.1 C 12/26/21 1152  Pulse 94 12/26/21 1155  Resp 13 12/26/21 1156  SpO2 97 % 12/26/21 1156  Vitals shown include unvalidated device data.  Last Pain:  Vitals:   12/26/21 1152  TempSrc:   PainSc: Asleep         Complications:  Encounter Notable Events  Notable Event Outcome Phase Comment  Difficult to intubate - expected  Intraprocedure Filed from anesthesia note documentation.

## 2021-12-26 NOTE — H&P (Signed)
Referring Physician:  No referring provider defined for this encounter.  Primary Physician:  Hortencia Pilar, MD  History of Present Illness: 12/26/2021 [name] is here today with a chief complaint of right leg pain.  She has failed conservative management  History of Present Illness: 09/29/2021  Since her last visit, [name] has had an injection on May 15 and has also undergone 5 weeks of physical therapy. She was discharged from physical therapy due to lack of improvement.  She is now ready to consider surgical intervention.  08/11/2021 [name] presents today with pain on her right leg down the back of her leg and her anterolateral calf.  She prickly has trouble when she stands or walks. She cannot stand in 1 position for any length of time. Her neck is much better.  06/16/21 [name] is here today with a chief complaint of neck pain that radiates in the right shoulder, shoulder blade and into the right arm. Occasionally has numbness and tingling in the arm.   She has been having pain for few months. She has sharp and stabbing pain mostly in her right shoulder blade and right arm. She has pain in her forearm and occasional pain into her hand. Raising her right arm and turning her head make it worse. Tramadol makes it better. She denies any weakness.  Bowel/Bladder Dysfunction: none  Conservative measures:  Physical therapy: participated in 1 visit 03/14/21 at [facility]; scheduled to start again on 06/21/21 at [facility] therapy including regular antiinflammatories: gabapentin, meloxicam, tizanidine, tramadol, prednisone, tylenol, cyclobenzaprine Injections: has received epidural steroid injections 05/26/21: right C6-7 TFESI by Dr Alba Destine (no relief, made that pain worse)  Past Surgery: none  Veronica Kennedy has no symptoms of cervical myelopathy.  The symptoms are causing a significant impact on the patient's life.    Review  of Systems:  A 10 point review of systems is negative, except for the pertinent positives and negatives detailed in the HPI.  Past Medical History: Past Medical History:  Diagnosis Date   Arthritis    Asthma    COPD (chronic obstructive pulmonary disease) ([place])    Dyspnea    GERD (gastroesophageal reflux disease)    Hypercholesterolemia    Hypertension    Melanoma ([place])    approximately 8 years ago resected from Right lower leg.    Multiple thyroid nodules    Seizure ([place]) 2013   x 1-unsure ot the cause    Past Surgical History: Past Surgical History:  Procedure Laterality Date   CATARACT EXTRACTION Bilateral    CHOLECYSTECTOMY     COLONOSCOPY     TONSILLECTOMY     TUBAL LIGATION      Allergies: Allergies as of 12/02/2021 - Review Complete 11/08/2021  Allergen Reaction Noted   Other  07/21/2020    Medications: Current Meds  Medication Sig   albuterol (PROVENTIL HFA;VENTOLIN HFA) 108 (90 BASE) MCG/ACT inhaler Inhale 2 puffs into the lungs every 6 (six) hours as needed for wheezing.   atorvastatin (LIPITOR) 10 MG tablet Take 1 tablet (10 mg total) by mouth daily. (Patient taking differently: Take 10 mg by mouth every morning.)   citalopram (CELEXA) 40 MG tablet Take 1.5 tablets (60 mg total) by mouth daily. (Patient taking differently: Take 40 mg by mouth every morning.)   Fluticasone-Salmeterol (ADVAIR) 250-50 MCG/DOSE AEPB Inhale 1 puff into the lungs every morning.   hyoscyamine (LEVSIN) 0.125 MG tablet Take 0.125 mg by  mouth every 4 (four) hours as needed for cramping.   pantoprazole (PROTONIX) 40 MG tablet 40 mg 2 (two) times daily.   ramipril (ALTACE) 5 MG capsule Take 5 mg by mouth every morning.   traZODone (DESYREL) 150 MG tablet TAKE 1 TABLET (150 MG TOTAL) AT BEDTIME (Patient taking differently: Take 200 mg by mouth at bedtime.)   umeclidinium bromide (INCRUSE ELLIPTA) 62.5 MCG/ACT AEPB Inhale 1 puff into the lungs every morning.    Social History: Social  History   Tobacco Use   Smoking status: Former    Packs/day: 2.00    Types: Cigarettes    Quit date: 06/08/2005    Years since quitting: 16.5   Smokeless tobacco: Never  Vaping Use   Vaping Use: Never used  Substance Use Topics   Alcohol use: No   Drug use: No    Family Medical History: Family History  Problem Relation Age of Onset   Breast cancer Maternal Aunt 44   Dementia Mother    Heart disease Mother    Diabetes Mother    Heart attack Father    AAA (abdominal aortic aneurysm) Father    Hypertension Father    Hyperlipidemia Father     Physical Examination: Vitals:   12/26/21 0820  BP: 126/88  Pulse: 83  Resp: 16  Temp: (!) 97.2 F (36.2 C)  SpO2: 98%   Heart sounds normal no MRG. Chest Clear to Auscultation Bilaterally.   General: Patient is well developed, well nourished, calm, collected, and in no apparent distress. Attention to examination is appropriate.  Neck:   Supple.    Respiratory: Patient is breathing without any difficulty.   NEUROLOGICAL:     Awake, alert, oriented to person, place, and time.  Speech is clear and fluent. Fund of knowledge is appropriate.   Cranial Nerves: Pupils equal round and reactive to light.  Facial tone is symmetric.  Facial sensation is symmetric. Shoulder shrug is symmetric. Tongue protrusion is midline.  There is no pronator drift.  ROM of spine: full.    Strength: Side Biceps Triceps Deltoid Interossei Grip Wrist Ext. Wrist Flex.  R '5 5 5 5 5 5 5  '$ L '5 5 5 5 5 5 5   '$ Side Iliopsoas Quads Hamstring PF DF EHL  R '5 5 5 5 5 5  '$ L '5 5 5 5 5 5   '$ Reflexes are 1+ and symmetric at the biceps, triceps, brachioradialis, patella and achilles.   Hoffman's is absent.  Bilateral upper and lower extremity sensation is intact to light touch.    No evidence of dysmetria noted.  Gait is normal.     Medical Decision Making  Imaging: MRI L spine 07/31/21 L4-L5: Mild broad-based disc osteophyte complex. Mild bilateral facet  arthropathy. Bilateral ligamentum flavum infolding. Bilateral subarticular recess stenosis. Moderate spinal stenosis. No foraminal stenosis.   L5-S1: Broad-based disc bulge. Moderate bilateral facet arthropathy. Mild bilateral foraminal stenosis. No spinal stenosis.   IMPRESSION: 1. Lumbar spine spondylosis as described above. 2. At L3-4 there is a broad-based disc bulge flattening ventral thecal sac with a small left paracentral disc protrusion contacting the left intraspinal L4 nerve root. No foraminal stenosis. Mild spinal stenosis. Left subarticular recess stenosis. 3. No acute osseous injury of the lumbar spine.     Electronically Signed   By: Kathreen Devoid M.D.   On: 07/31/2021 09:42  I have personally reviewed the images and agree with the above interpretation.  Assessment and Plan: Veronica Kennedy is  a pleasant 66 y.o. female with right L5 radiculopathy.  She has bilateral lateral recess stenosis at L4/5.  She failed conservative management prompting surgical intervention  with right L4/5 laminoforaminotomy.      Aerionna Moravek K. Izora Ribas MD, [facility]

## 2021-12-26 NOTE — Anesthesia Procedure Notes (Signed)
Procedure Name: Intubation Date/Time: 12/26/2021 10:22 AM  Performed by: Jonna Clark, CRNAPre-anesthesia Checklist: Patient identified, Patient being monitored, Timeout performed, Emergency Drugs available and Suction available Patient Re-evaluated:Patient Re-evaluated prior to induction Oxygen Delivery Method: Circle system utilized Preoxygenation: Pre-oxygenation with 100% oxygen Induction Type: IV induction Ventilation: Mask ventilation without difficulty Laryngoscope Size: 3 and McGraph Grade View: Grade II Tube type: Oral Tube size: 7.0 mm Number of attempts: 1 Airway Equipment and Method: Stylet Placement Confirmation: ETT inserted through vocal cords under direct vision, positive ETCO2 and breath sounds checked- equal and bilateral Secured at: 20 cm Tube secured with: Tape Dental Injury: Teeth and Oropharynx as per pre-operative assessment  Difficulty Due To: Difficulty was anticipated and Difficult Airway- due to anterior larynx Future Recommendations: Recommend- induction with short-acting agent, and alternative techniques readily available Comments: Use of McGrath very helpful

## 2021-12-26 NOTE — Anesthesia Postprocedure Evaluation (Signed)
Anesthesia Post Note  Patient: Veronica Kennedy  Procedure(s) Performed: RIGHT L4-5 LAMINOFORAMINOTOMY (Right: Spine Lumbar)  Patient location during evaluation: PACU Anesthesia Type: General Level of consciousness: awake and alert, oriented and patient cooperative Pain management: pain level controlled Vital Signs Assessment: post-procedure vital signs reviewed and stable Respiratory status: spontaneous breathing, nonlabored ventilation and respiratory function stable Cardiovascular status: blood pressure returned to baseline and stable Postop Assessment: adequate PO intake Anesthetic complications: yes   Encounter Notable Events  Notable Event Outcome Phase Comment  Difficult to intubate - expected  Intraprocedure Filed from anesthesia note documentation.     Last Vitals:  Vitals:   12/26/21 1350 12/26/21 1420  BP: 137/84 130/87  Pulse: 84 78  Resp: 16 16  Temp:  36.9 C  SpO2: 99% 98%    Last Pain:  Vitals:   12/26/21 1420  TempSrc: Temporal  PainSc: [place]

## 2021-12-26 NOTE — Discharge Instructions (Addendum)
Your surgeon has performed an operation on your lumbar spine (low back) to relieve pressure on one or more nerves. Many times, patients feel better immediately after surgery and can "overdo it." Even if you feel well, it is important that you follow these activity guidelines. If you do not let your back heal properly from the surgery, you can increase the chance of a disc herniation and/or return of your symptoms. The following are instructions to help in your recovery once you have been discharged from the hospital.  * It is ok to take NSAIDs after surgery.  Activity    No bending, lifting, or twisting ("BLT"). Avoid lifting objects heavier than 10 pounds (gallon milk jug).  Where possible, avoid household activities that involve lifting, bending, pushing, or pulling such as laundry, vacuuming, grocery shopping, and childcare. Try to arrange for help from friends and family for these activities while your back heals.  Increase physical activity slowly as tolerated.  Taking short walks is encouraged, but avoid strenuous exercise. Do not jog, run, bicycle, lift weights, or participate in any other exercises unless specifically allowed by your doctor. Avoid prolonged sitting, including car rides.  Talk to your doctor before resuming sexual activity.  You should not drive until cleared by your doctor.  Until released by your doctor, you should not return to work or school.  You should rest at home and let your body heal.   You may shower two days after your surgery.  After showering, lightly dab your incision dry. Do not take a tub bath or go swimming for 3 weeks, or until approved by your doctor at your follow-up appointment.  If you smoke, we strongly recommend that you quit.  Smoking has been proven to interfere with normal healing in your back and will dramatically reduce the success rate of your surgery. Please contact [facility] (800-QUIT-NOW) and use the resources at www.[facility].com for  assistance in stopping smoking.  Surgical Incision   If you have a dressing on your incision, you may remove it three days after your surgery. Keep your incision area clean and dry.  You have glue on your incision. The glue should begin to peel away within about a week.   Diet            You may return to your usual diet. Be sure to stay hydrated.  When to Contact us  Although your surgery and recovery will likely be uneventful, you may have some residual numbness, aches, and pains in your back and/or legs. This is normal and should improve in the next few weeks.  However, should you experience any of the following, contact us immediately: New numbness or weakness Pain that is progressively getting worse, and is not relieved by your pain medications or rest Bleeding, redness, swelling, pain, or drainage from surgical incision Chills or flu-like symptoms Fever greater than 101.0 F (38.3 C) Problems with bowel or bladder functions Difficulty breathing or shortness of breath Warmth, tenderness, or swelling in your calf  Contact Information During office hours (Monday-Friday 9 am to 5 pm), please call your physician at [phone] and ask for Berdine Addison After hours and weekends, please call [phone] and speak with the neurosurgeon on call For a life-threatening emergency, call [place]   The drugs that you were given will stay in your system until tomorrow so for the next 24 hours you should not:  Drive an automobile Make any legal decisions Drink any  alcoholic beverage   You may resume regular meals tomorrow.  Today it is better to start with liquids and gradually work up to solid foods.  You may eat anything you prefer, but it is better to start with liquids, then soup and crackers, and gradually work up to solid foods.   Please notify your doctor immediately if you have any unusual bleeding, trouble breathing, redness and  pain at the surgery site, drainage, fever, or pain not relieved by medication.     Additional Instructions:

## 2021-12-26 NOTE — Discharge Summary (Signed)
Physician Discharge Summary  Patient ID: LILIYA FULLENWIDER MRN: [id number] DOB/AGE: 05/18/1955 66 y.o.  Admit date: 12/26/2021 Discharge date: 12/26/2021  Admission Diagnoses: lumbar radiculopathy  Discharge Diagnoses:  Active Problems:   Lumbar radiculopathy   Discharged Condition: good  Hospital Course: admitted for right L4-L5 laminoforaminotomy. Procedure went well and she was stable for discharge. Sent home on norco 5 and flexeril (from home).   Consults: None  Significant Diagnostic Studies: none  Treatments: surgery as above.   Discharge Exam: Blood pressure 126/88, pulse 83, temperature (!) 97.2 F (36.2 C), temperature source Temporal, resp. rate 16, height '5\' 4"'$  (1.626 m), weight 83 kg, SpO2 98 %. Doing well postop and moving all extremities.   Disposition: Discharge disposition: 01-Home or Self Care       Discharge Instructions     Discharge patient   Complete by: As directed    Discharge disposition: 01-Home or Self Care   Discharge patient date: 12/26/2021      Allergies as of 12/26/2021       Reactions   Other    Other reaction(s): Other (See Comments) Seasonal allergies causing sinus congestion        Medication List     STOP taking these medications    traMADol 50 MG tablet Commonly known as: ULTRAM       TAKE these medications    albuterol 108 (90 Base) MCG/ACT inhaler Commonly known as: VENTOLIN HFA Inhale 2 puffs into the lungs every 6 (six) hours as needed for wheezing.   atorvastatin 10 MG tablet Commonly known as: LIPITOR Take 1 tablet (10 mg total) by mouth daily. What changed: when to take this   CALCIUM 500 + D PO Take 1 tablet by mouth daily at 6 (six) AM.   citalopram 40 MG tablet Commonly known as: CELEXA Take 1.5 tablets (60 mg total) by mouth daily. What changed:  how much to take when to take this   cyclobenzaprine 10 MG tablet Commonly known as: FLEXERIL Take 1 tablet (10 mg total) by mouth 3 (three)  times daily as needed for muscle spasms. Do not drive while taking as can cause drowsiness   Fluticasone-Salmeterol 250-50 MCG/DOSE Aepb Commonly known as: ADVAIR Inhale 1 puff into the lungs every morning.   HYDROcodone-acetaminophen 5-325 MG tablet Commonly known as: Norco Take 1 tablet by mouth every 6 (six) hours as needed for moderate pain.   hyoscyamine 0.125 MG tablet Commonly known as: LEVSIN Take 0.125 mg by mouth every 4 (four) hours as needed for cramping.   Incruse Ellipta 62.5 MCG/ACT Aepb Generic drug: umeclidinium bromide Inhale 1 puff into the lungs every morning.   pantoprazole 40 MG tablet Commonly known as: PROTONIX 40 mg 2 (two) times daily.   ramipril 5 MG capsule Commonly known as: ALTACE Take 5 mg by mouth every morning.   SUMAtriptan 25 MG tablet Commonly known as: IMITREX May repeat in 2 hours if headache persists or recurs. What changed:  how much to take how to take this when to take this reasons to take this   traZODone 150 MG tablet Commonly known as: DESYREL TAKE 1 TABLET (150 MG TOTAL) AT BEDTIME What changed:  how much to take how to take this when to take this additional instructions        Follow-up Information     Geronimo Boot, PA-C Follow up.   Specialty: Neurosurgery Why: Follow up as scheduled for postop visit on 01/10/22 Contact information: [address] [employer]  [address] [place] [address] [phone]                 Signed: Jannett Celestine 12/26/2021, 11:56 AM

## 2021-12-26 NOTE — Anesthesia Preprocedure Evaluation (Addendum)
Anesthesia Evaluation  Patient identified by MRN, date of birth, ID band Patient awake    Reviewed: Allergy & Precautions, NPO status , Patient's Chart, lab work & pertinent test results  History of Anesthesia Complications Negative for: history of anesthetic complications  Airway Mallampati: III   Neck ROM: Full    Dental  (+) Missing   Pulmonary asthma , COPD, former smoker (quit 2007),    Pulmonary exam normal breath sounds clear to auscultation       Cardiovascular hypertension, Normal cardiovascular exam Rhythm:Regular Rate:Normal  ECG 12/14/21: normal   Neuro/Psych  Headaches, Seizures - (x1), Well Controlled,     GI/Hepatic GERD  ,  Endo/Other  negative endocrine ROS  Renal/GU negative Renal ROS     Musculoskeletal  (+) Arthritis ,   Abdominal   Peds  Hematology negative hematology ROS (+)   Anesthesia Other Findings   Reproductive/Obstetrics                            Anesthesia Physical Anesthesia Plan  ASA: 2  Anesthesia Plan: General   Post-op Pain Management:    Induction: Intravenous  PONV Risk Score and Plan: 3 and Ondansetron, Dexamethasone and Treatment may vary due to age or medical condition  Airway Management Planned: Oral ETT  Additional Equipment:   Intra-op Plan:   Post-operative Plan: Extubation in OR  Informed Consent: I have reviewed the patients History and Physical, chart, labs and discussed the procedure including the risks, benefits and alternatives for the proposed anesthesia with the patient or authorized representative who has indicated his/her understanding and acceptance.     Dental advisory given  Plan Discussed with: CRNA  Anesthesia Plan Comments: (Patient consented for risks of anesthesia including but not limited to:  - adverse reactions to medications - damage to eyes, teeth, lips or other oral mucosa - nerve damage due to  positioning  - sore throat or hoarseness - damage to heart, brain, nerves, lungs, other parts of body or loss of life  Informed patient about role of CRNA in peri- and intra-operative care.  Patient voiced understanding.)        Anesthesia Quick Evaluation

## 2021-12-27 ENCOUNTER — Encounter: Payer: Self-pay | Admitting: Neurosurgery

## 2022-01-09 NOTE — Progress Notes (Unsigned)
   REFERRING PHYSICIAN:  Hortencia Pilar, [address], [address] [place],  [place] [address]  DOS: Right L4-5 laminoforaminotomy on 12/26/21  HISTORY OF PRESENT ILLNESS: Veronica Kennedy is approximately 2 weeks status post Right L4-5 laminoforaminotomy. Was given norco on discharge from the hospital. She was to continue on flexeril that she had at home.   She is taking norco prn. Not taking flexeril. She continues with right leg pain that is not much different from her preop pain. She has numbness and tingling in right leg.    PHYSICAL EXAMINATION:  General: Patient is well developed, well nourished, calm, collected, and in no apparent distress.   NEUROLOGICAL:  General: In no acute distress.   Awake, alert, oriented to person, place, and time.  Pupils equal round and reactive to light.  Facial tone is symmetric.  Tongue protrusion is midline.  There is no pronator drift.   Strength:          Side Iliopsoas Quads Hamstring PF DF EHL  R '5 5 5 5 5 5  '$ L '5 5 5 5 5 5   '$ Incision c/d/i   ROS (Neurologic):  Negative except as noted above  IMAGING: Nothing new to review.   ASSESSMENT/PLAN:  Veronica Kennedy is doing reasonable s/p above surgery. Treatment options reviewed with patient and following plan made:   - Discussed that her pain should continue to improve over time.  - I have advised the patient to lift up to 10 pounds until 6 weeks after surgery (follow up with Dr. Izora Ribas).  - Reviewed wound care.  - No bending, twisting, or lifting.  - Continue on current medications including prn norco. Refill given. PMP reviewed and is appropriate. Reviewed dosing and side effects. Take only as needed for severe pain.   - Unable to tolerate neurontin previously.  - If pain gets worse, can consider medrol dose pack.  - Follow up as scheduled in 4 weeks and prn.   Advised to contact the office if any questions or concerns arise.  Geronimo Boot PA-C Department of neurosurgery

## 2022-01-10 ENCOUNTER — Encounter: Payer: Self-pay | Admitting: Orthopedic Surgery

## 2022-01-10 ENCOUNTER — Ambulatory Visit (INDEPENDENT_AMBULATORY_CARE_PROVIDER_SITE_OTHER): Payer: Medicare HMO | Admitting: Orthopedic Surgery

## 2022-01-10 VITALS — BP 124/78

## 2022-01-10 DIAGNOSIS — Z9889 Other specified postprocedural states: Secondary | ICD-10-CM

## 2022-01-10 DIAGNOSIS — Z09 Encounter for follow-up examination after completed treatment for conditions other than malignant neoplasm: Secondary | ICD-10-CM

## 2022-01-10 DIAGNOSIS — M5416 Radiculopathy, lumbar region: Secondary | ICD-10-CM

## 2022-01-10 MED ORDER — HYDROCODONE-ACETAMINOPHEN 5-325 MG PO TABS: 1.0000 | ORAL_TABLET | Freq: Three times a day (TID) | ORAL | 0 refills | Status: DC | PRN

## 2022-02-07 ENCOUNTER — Ambulatory Visit (INDEPENDENT_AMBULATORY_CARE_PROVIDER_SITE_OTHER): Payer: Medicare HMO | Admitting: Neurosurgery

## 2022-02-07 ENCOUNTER — Encounter: Payer: Self-pay | Admitting: Neurosurgery

## 2022-02-07 VITALS — BP 124/78 | Temp 97.8°F | Ht 64.0 in | Wt 183.0 lb

## 2022-02-07 DIAGNOSIS — M5416 Radiculopathy, lumbar region: Secondary | ICD-10-CM

## 2022-02-07 NOTE — Progress Notes (Signed)
   REFERRING PHYSICIAN:  Meade Maw, [address] [place],  [place] [address]  DOS: Right L4-5 laminoforaminotomy on 12/26/21  HISTORY OF PRESENT ILLNESS: Veronica Kennedy is  status post Right L4-5 laminoforaminotomy.  She is improving.  She still has back pain when she stands in 1 position.     PHYSICAL EXAMINATION:  General: Patient is well developed, well nourished, calm, collected, and in no apparent distress.   NEUROLOGICAL:  General: In no acute distress.   Awake, alert, oriented to person, place, and time.  Pupils equal round and reactive to light.  Facial tone is symmetric.  Tongue protrusion is midline.  There is no pronator drift.   Strength:          Side Iliopsoas Quads Hamstring PF DF EHL  R '5 5 5 5 5 5  '$ L '5 5 5 5 5 5   '$ Incision c/d/i   ROS (Neurologic):  Negative except as noted above  IMAGING: Nothing new to review.   ASSESSMENT/PLAN:  Veronica Kennedy is doing reasonable s/p above surgery.   We will start physical therapy for her.  We reviewed her activity limitations.  I will see her back in 6 weeks.    Meade Maw MD Department of neurosurgery

## 2022-02-16 ENCOUNTER — Inpatient Hospital Stay
Admission: RE | Admit: 2022-02-16 | Discharge: 2022-02-16 | Disposition: A | Discharge status: Home / Self Care | Payer: Medicare HMO | Source: Ambulatory Visit | Attending: Family Medicine | Admitting: Family Medicine

## 2022-03-21 ENCOUNTER — Encounter: Payer: Self-pay | Admitting: Neurosurgery

## 2022-03-21 ENCOUNTER — Ambulatory Visit (INDEPENDENT_AMBULATORY_CARE_PROVIDER_SITE_OTHER): Payer: Medicare HMO | Admitting: Neurosurgery

## 2022-03-21 VITALS — BP 142/94 | HR 83 | Temp 97.5°F

## 2022-03-21 DIAGNOSIS — Z9889 Other specified postprocedural states: Secondary | ICD-10-CM

## 2022-03-21 DIAGNOSIS — M546 Pain in thoracic spine: Secondary | ICD-10-CM

## 2022-03-21 DIAGNOSIS — M5416 Radiculopathy, lumbar region: Secondary | ICD-10-CM

## 2022-03-21 DIAGNOSIS — Z09 Encounter for follow-up examination after completed treatment for conditions other than malignant neoplasm: Secondary | ICD-10-CM

## 2022-03-21 NOTE — Progress Notes (Signed)
   REFERRING PHYSICIAN:  Hortencia Pilar, [address], [address] [place],  [place] [address]  DOS: Right L4-5 laminoforaminotomy on 12/26/21  HISTORY OF PRESENT ILLNESS: Veronica Kennedy is  status post Right L4-5 laminoforaminotomy.  Her leg pain is gone.  She does have some mid back pain.  This is at her thoracolumbar junction.  PHYSICAL EXAMINATION:  General: Patient is well developed, well nourished, calm, collected, and in no apparent distress.   NEUROLOGICAL:  General: In no acute distress.   Awake, alert, oriented to person, place, and time.  Pupils equal round and reactive to light.  Facial tone is symmetric.  Tongue protrusion is midline.  There is no pronator drift.   Strength:          Side Iliopsoas Quads Hamstring PF DF EHL  R '5 5 5 5 5 5  '$ L '5 5 5 5 5 5   '$ Incision c/d/i   ROS (Neurologic):  Negative except as noted above  IMAGING: Nothing new to review.   ASSESSMENT/PLAN:  Veronica Kennedy is doing well s/p above surgery.  I think she should see a chiropractor to try to help with her thoracic back pain.  She will continue exercise for now.  Her radicular pain is much better.  If she does not improve with chiropractic care, we will reassess for other possible etiologies of her back pain.    Meade Maw MD Department of neurosurgery

## 2022-03-28 ENCOUNTER — Other Ambulatory Visit: Payer: Self-pay | Admitting: Family Medicine

## 2022-03-28 DIAGNOSIS — Z1231 Encounter for screening mammogram for malignant neoplasm of breast: Secondary | ICD-10-CM

## 2022-04-03 ENCOUNTER — Encounter: Payer: Self-pay | Admitting: Gastroenterology

## 2022-04-03 ENCOUNTER — Ambulatory Visit: Payer: Medicare HMO | Admitting: Gastroenterology

## 2022-04-03 VITALS — BP 117/81 | HR 82 | Temp 98.0°F | Ht 64.0 in | Wt 189.0 lb

## 2022-04-03 DIAGNOSIS — K5900 Constipation, unspecified: Secondary | ICD-10-CM | POA: Diagnosis not present

## 2022-04-03 DIAGNOSIS — R1013 Epigastric pain: Secondary | ICD-10-CM | POA: Diagnosis not present

## 2022-04-03 MED ORDER — NA SULFATE-K SULFATE-MG SULF 17.5-3.13-1.6 GM/177ML PO SOLN: 1.0000 | Freq: Once | ORAL | 0 refills | Status: AC

## 2022-04-03 NOTE — Addendum Note (Signed)
Addended by: Lurlean Nanny on: 04/03/2022 03:21 PM   Modules accepted: Orders

## 2022-04-03 NOTE — Progress Notes (Signed)
Gastroenterology Consultation  Referring Provider:     Hortencia Pilar, MD Primary Care Physician:  Hortencia Pilar, MD Primary Gastroenterologist:  Dr. Allen Norris     Reason for Consultation:     Epigastric pain        HPI:   Veronica Kennedy is a 66 y.o. y/o female referred for consultation & management of epigastric pain by Dr. Hortencia Pilar, MD. This patient comes to see me after being seen in the past by [facility].  The patient has had abdominal pain dating back to 2017 with a CT scan showing a possible nodule liver.  The patient then underwent another imaging test in 2019 with a ultrasound showing no cirrhosis at the same time.  The ultrasound did not show any abnormalities except status post cholecystectomy and a fibrosis score of F0 to F1.  The patient had reported that she no longer wanted to be seen at [facility] clinic therefore was referred to me. Patient reports that when she gets the attacks he has even had to go to the ER.  Workups have been negative for any cause.  The patient is on Protonix.  She states that when she takes Tums and Gas-X her symptoms get better.  She also reports that she has had lifelong constipation.  She has to take something every day before she goes to sleep and sometimes has loose bowel movements because of it.  The patient is not having any of the symptoms at the present time.  She also reports that she does suffer from back pain.  When the pain comes there is no aggravating or relieving symptoms except what was mentioned above.   Past Medical History:  Diagnosis Date   Arthritis    Asthma    COPD (chronic obstructive pulmonary disease) ([place])    Dyspnea    GERD (gastroesophageal reflux disease)    Hypercholesterolemia    Hypertension    Melanoma ([place])    approximately 8 years ago resected from Right lower leg.    Multiple thyroid nodules    Seizure ([place]) 2013   x 1-unsure ot the cause    Past Surgical History:  Procedure Laterality Date    CATARACT EXTRACTION Bilateral    CHOLECYSTECTOMY     COLONOSCOPY     LUMBAR LAMINECTOMY/DECOMPRESSION MICRODISCECTOMY Right 12/26/2021   Procedure: RIGHT L4-5 LAMINOFORAMINOTOMY;  Surgeon: Meade Maw, MD;  Location: [facility] ORS;  Service: Neurosurgery;  Laterality: Right;   TONSILLECTOMY     TUBAL LIGATION      Prior to Admission medications   Medication Sig Start Date End Date Taking? Authorizing Provider  albuterol (PROVENTIL HFA;VENTOLIN HFA) 108 (90 BASE) MCG/ACT inhaler Inhale 2 puffs into the lungs every 6 (six) hours as needed for wheezing.    [provider]  atorvastatin (LIPITOR) 10 MG tablet Take 1 tablet (10 mg total) by mouth daily. Patient taking differently: Take 10 mg by mouth every morning. 11/08/12   Einar Pheasant, MD  Calcium Carb-Cholecalciferol (CALCIUM 500 + D PO) Take 1 tablet by mouth daily at 6 (six) AM.    [provider]  citalopram (CELEXA) 40 MG tablet Take 1.5 tablets (60 mg total) by mouth daily. Patient taking differently: Take 40 mg by mouth every morning. 09/21/12   Einar Pheasant, MD  cyclobenzaprine (FLEXERIL) 10 MG tablet Take 1 tablet (10 mg total) by mouth 3 (three) times daily as needed for muscle spasms. Do not drive while taking as can cause drowsiness 02/11/21  Danton Clap, PA-C  Fluticasone-Salmeterol (ADVAIR) 250-50 MCG/DOSE AEPB Inhale 1 puff into the lungs every morning. 05/24/18   [provider]  HYDROcodone-acetaminophen (NORCO) 5-325 MG tablet Take 1 tablet by mouth every 8 (eight) hours as needed for moderate pain. 01/10/22   Geronimo Boot, PA-C  hyoscyamine (LEVSIN) 0.125 MG tablet Take 0.125 mg by mouth every 4 (four) hours as needed for cramping.    [provider]  pantoprazole (PROTONIX) 40 MG tablet 40 mg 2 (two) times daily. 01/07/18   [provider]  ramipril (ALTACE) 5 MG capsule Take 5 mg by mouth every morning. 10/10/19   [provider]  SUMAtriptan (IMITREX) 25 MG tablet  May repeat in 2 hours if headache persists or recurs. Patient taking differently: Take 25 mg by mouth every 2 (two) hours as needed. May repeat in 2 hours if headache persists or recurs. 02/11/21   Danton Clap, PA-C  traZODone (DESYREL) 150 MG tablet TAKE 1 TABLET (150 MG TOTAL) AT BEDTIME Patient taking differently: Take 200 mg by mouth at bedtime. 01/07/13   Einar Pheasant, MD  umeclidinium bromide (INCRUSE ELLIPTA) 62.5 MCG/ACT AEPB Inhale 1 puff into the lungs every morning.    [provider]  esomeprazole (NEXIUM) 40 MG capsule Take 1 capsule (40 mg total) by mouth daily. 12/02/12 10/28/18  Einar Pheasant, MD  fluticasone (FLONASE) 50 MCG/ACT nasal spray Place 2 sprays into both nostrils daily. 07/15/15 10/28/18  Frederich Cha, MD  hydrochlorothiazide (HYDRODIURIL) 25 MG tablet Take 1 tablet (25 mg total) by mouth daily. 09/30/12 04/12/20  Einar Pheasant, MD    Family History  Problem Relation Age of Onset   Breast cancer Maternal Aunt 44   Dementia Mother    Heart disease Mother    Diabetes Mother    Heart attack Father    AAA (abdominal aortic aneurysm) Father    Hypertension Father    Hyperlipidemia Father      Social History   Tobacco Use   Smoking status: Former    Packs/day: 2.00    Types: Cigarettes    Quit date: 06/08/2005    Years since quitting: 16.8   Smokeless tobacco: Never  Vaping Use   Vaping Use: Never used  Substance Use Topics   Alcohol use: No   Drug use: No    Allergies as of 04/03/2022 - Review Complete 03/21/2022  Allergen Reaction Noted   Other  07/21/2020    Review of Systems:    All systems reviewed and negative except where noted in HPI.   Physical Exam:  There were no vitals taken for this visit. No LMP recorded. Patient is postmenopausal. General:   Alert,  Well-developed, well-nourished, pleasant and cooperative in NAD Head:  Normocephalic and atraumatic. Eyes:  Sclera clear, no icterus.   Conjunctiva pink. Ears:  Normal  auditory acuity. Neck:  Supple; no masses or thyromegaly. Lungs:  Respirations even and unlabored.  Clear throughout to auscultation.   No wheezes, crackles, or rhonchi. No acute distress. Heart:  Regular rate and rhythm; no murmurs, clicks, rubs, or gallops. Abdomen:  Normal bowel sounds.  No bruits.  Soft, non-tender and non-distended without masses, hepatosplenomegaly or hernias noted.  No guarding or rebound tenderness.  Negative Carnett sign.   Rectal:  Deferred.  Pulses:  Normal pulses noted. Extremities:  No clubbing or edema.  No cyanosis. Neurologic:  Alert and oriented x3;  grossly normal neurologically. Skin:  Intact without significant lesions or rashes.  No jaundice. Lymph  Nodes:  No significant cervical adenopathy. Psych:  Alert and cooperative. Normal mood and affect.  Imaging Studies: No results found.  Assessment and Plan:   Veronica Kennedy is a 66 y.o. y/o female who comes in today with a history of epigastric pain that gets better when she takes Tums and Gas-X.  The patient also states that she has back pain and her symptoms are not exacerbated by anything she eats or drinks.  The patient has been told that these may be muscular spasms although since she is not having any symptoms at present time I cannot be 100% sure.  The patient has been set up for an EGD and colonoscopy to look for source of her pain and because she has not had a colonoscopy in 10 years.  The patient has been told to continue her Protonix and has been instructed if she gets the pain to do a straight leg lift to see if the pain is exacerbated by lifting her legs thereby indicating this being musculoskeletal.  The patient and her husband have been explained the plan and will follow-up at the time of the procedures.    Lucilla Lame, MD. Marval Regal    Note: This dictation was prepared with Dragon dictation along with smaller phrase technology. Any transcriptional errors that result from this process are  unintentional.

## 2022-04-06 ENCOUNTER — Encounter: Payer: Self-pay | Admitting: Gastroenterology

## 2022-04-20 ENCOUNTER — Ambulatory Visit: Payer: Medicare HMO | Admitting: Anesthesiology

## 2022-04-20 ENCOUNTER — Other Ambulatory Visit: Payer: Self-pay

## 2022-04-20 ENCOUNTER — Encounter
Admission: RE | Disposition: A | Discharge status: Home / Self Care | Payer: Self-pay | Source: Home / Self Care | Attending: Gastroenterology

## 2022-04-20 ENCOUNTER — Encounter: Payer: Self-pay | Admitting: Gastroenterology

## 2022-04-20 ENCOUNTER — Ambulatory Visit
Admission: RE | Admit: 2022-04-20 | Discharge: 2022-04-20 | Disposition: A | Discharge status: Home / Self Care | Payer: Medicare HMO | Attending: Gastroenterology | Admitting: Gastroenterology

## 2022-04-20 DIAGNOSIS — Z87891 Personal history of nicotine dependence: Secondary | ICD-10-CM | POA: Diagnosis not present

## 2022-04-20 DIAGNOSIS — K219 Gastro-esophageal reflux disease without esophagitis: Secondary | ICD-10-CM | POA: Diagnosis not present

## 2022-04-20 DIAGNOSIS — K640 First degree hemorrhoids: Secondary | ICD-10-CM | POA: Diagnosis not present

## 2022-04-20 DIAGNOSIS — D120 Benign neoplasm of cecum: Secondary | ICD-10-CM | POA: Insufficient documentation

## 2022-04-20 DIAGNOSIS — G709 Myoneural disorder, unspecified: Secondary | ICD-10-CM | POA: Diagnosis not present

## 2022-04-20 DIAGNOSIS — I10 Essential (primary) hypertension: Secondary | ICD-10-CM | POA: Diagnosis not present

## 2022-04-20 DIAGNOSIS — K5900 Constipation, unspecified: Secondary | ICD-10-CM

## 2022-04-20 DIAGNOSIS — Z1211 Encounter for screening for malignant neoplasm of colon: Secondary | ICD-10-CM

## 2022-04-20 DIAGNOSIS — K5730 Diverticulosis of large intestine without perforation or abscess without bleeding: Secondary | ICD-10-CM | POA: Insufficient documentation

## 2022-04-20 DIAGNOSIS — K317 Polyp of stomach and duodenum: Secondary | ICD-10-CM | POA: Diagnosis not present

## 2022-04-20 DIAGNOSIS — Z8249 Family history of ischemic heart disease and other diseases of the circulatory system: Secondary | ICD-10-CM | POA: Insufficient documentation

## 2022-04-20 DIAGNOSIS — J449 Chronic obstructive pulmonary disease, unspecified: Secondary | ICD-10-CM | POA: Diagnosis not present

## 2022-04-20 DIAGNOSIS — Z7951 Long term (current) use of inhaled steroids: Secondary | ICD-10-CM | POA: Insufficient documentation

## 2022-04-20 DIAGNOSIS — K635 Polyp of colon: Secondary | ICD-10-CM | POA: Diagnosis not present

## 2022-04-20 DIAGNOSIS — R1013 Epigastric pain: Secondary | ICD-10-CM

## 2022-04-20 DIAGNOSIS — K449 Diaphragmatic hernia without obstruction or gangrene: Secondary | ICD-10-CM | POA: Diagnosis not present

## 2022-04-20 HISTORY — PX: ESOPHAGOGASTRODUODENOSCOPY (EGD) WITH PROPOFOL: SHX5813

## 2022-04-20 HISTORY — PX: COLONOSCOPY WITH PROPOFOL: SHX5780

## 2022-04-20 HISTORY — DX: Presence of dental prosthetic device (complete) (partial): Z97.2

## 2022-04-20 HISTORY — PX: POLYPECTOMY: SHX5525

## 2022-04-20 SURGERY — COLONOSCOPY WITH PROPOFOL
Anesthesia: General | Site: Rectum

## 2022-04-20 MED ORDER — ONDANSETRON HCL 4 MG/2ML IJ SOLN: 4.0000 mg | Freq: Once | INTRAMUSCULAR | Status: DC | PRN

## 2022-04-20 MED ORDER — LIDOCAINE HCL (CARDIAC) PF 100 MG/5ML IV SOSY
PREFILLED_SYRINGE | INTRAVENOUS | Status: DC | PRN
  Administered 2022-04-20: 60 mg via INTRAVENOUS

## 2022-04-20 MED ORDER — GLYCOPYRROLATE 0.2 MG/ML IJ SOLN
INTRAMUSCULAR | Status: DC | PRN
  Administered 2022-04-20: .2 mg via INTRAVENOUS

## 2022-04-20 MED ORDER — PROPOFOL 10 MG/ML IV BOLUS
INTRAVENOUS | Status: DC | PRN
  Administered 2022-04-20: 30 mg via INTRAVENOUS
  Administered 2022-04-20: 50 mg via INTRAVENOUS
  Administered 2022-04-20: 40 mg via INTRAVENOUS
  Administered 2022-04-20: 80 mg via INTRAVENOUS
  Administered 2022-04-20: 20 mg via INTRAVENOUS

## 2022-04-20 MED ORDER — SODIUM CHLORIDE 0.9 % IV SOLN: INTRAVENOUS | Status: DC

## 2022-04-20 MED ORDER — STERILE WATER FOR IRRIGATION IR SOLN
Status: DC | PRN
  Administered 2022-04-20 (×2): 60 mL

## 2022-04-20 MED ORDER — LACTATED RINGERS IV SOLN: INTRAVENOUS | Status: DC

## 2022-04-20 MED ORDER — FENTANYL CITRATE PF 50 MCG/ML IJ SOSY: 25.0000 ug | PREFILLED_SYRINGE | INTRAMUSCULAR | Status: DC | PRN

## 2022-04-20 MED ORDER — STERILE WATER FOR IRRIGATION IR SOLN
Status: DC | PRN
  Administered 2022-04-20: 100 mL

## 2022-04-20 SURGICAL SUPPLY — 8 items
BLOCK BITE 60FR ADLT L/F GRN (MISCELLANEOUS) ×2 IMPLANT
FORCEPS BIOP RAD 4 LRG CAP 4 (CUTTING FORCEPS) IMPLANT
GOWN CVR UNV OPN BCK APRN NK (MISCELLANEOUS) ×4 IMPLANT
GOWN ISOL THUMB LOOP REG UNIV (MISCELLANEOUS) ×4
KIT PRC NS LF DISP ENDO (KITS) ×4 IMPLANT
KIT PROCEDURE OLYMPUS (KITS) ×4
MANIFOLD NEPTUNE II (INSTRUMENTS) ×4 IMPLANT
WATER STERILE IRR 500ML POUR (IV SOLUTION) IMPLANT

## 2022-04-20 NOTE — Op Note (Signed)
[facility] Gastroenterology Patient Name: Veronica Kennedy Procedure Date: 04/20/2022 7:19 AM MRN: [id number] Account #: [id number] Date of Birth: [birth year] Admit Type: Outpatient Age: 67 Room: [facility] OR ROOM 01 Gender: Female Note Status: Finalized Instrument Name: [id number] Procedure:             Colonoscopy Indications:           Screening for colorectal malignant neoplasm Providers:             Lucilla Lame MD, MD Referring MD:          Kerin Perna MD, MD (Referring MD) Medicines:             Propofol per Anesthesia Complications:         No immediate complications. Procedure:             Pre-Anesthesia Assessment:                        - Prior to the procedure, a History and Physical was                         performed, and patient medications and allergies were                         reviewed. The patient's tolerance of previous                         anesthesia was also reviewed. The risks and benefits                         of the procedure and the sedation options and risks                         were discussed with the patient. All questions were                         answered, and informed consent was obtained. Prior                         Anticoagulants: The patient has taken no anticoagulant                         or antiplatelet agents. ASA Grade Assessment: II - A                         patient with mild systemic disease. After reviewing                         the risks and benefits, the patient was deemed in                         satisfactory condition to undergo the procedure.                        After obtaining informed consent, the colonoscope was                         passed under direct vision. Throughout the procedure,  the patient's blood pressure, pulse, and oxygen                         saturations were monitored continuously. The                         Colonoscope was introduced through the anus  and                         advanced to the the cecum, identified by appendiceal                         orifice and ileocecal valve. The colonoscopy was                         performed without difficulty. The patient tolerated                         the procedure well. The quality of the bowel                         preparation was excellent. The colonoscopy was                         performed without difficulty. The patient tolerated                         the procedure well. The quality of the bowel                         preparation was excellent. Findings:      The perianal and digital rectal examinations were normal.      A 4 mm polyp was found in the cecum. The polyp was sessile. The polyp       was removed with a cold biopsy forceps. Resection and retrieval were       complete.      A few small-mouthed diverticula were found in the sigmoid colon.      Non-bleeding internal hemorrhoids were found during retroflexion. The       hemorrhoids were Grade I (internal hemorrhoids that do not prolapse). Impression:            - One 4 mm polyp in the cecum, removed with a cold                         biopsy forceps. Resected and retrieved.                        - Diverticulosis in the sigmoid colon.                        - Non-bleeding internal hemorrhoids. Recommendation:        - Discharge patient to home.                        - Resume previous diet.                        - Continue present medications.                        -  Await pathology results.                        - If the pathology report reveals adenomatous tissue,                         then repeat the colonoscopy for surveillance in 7                         years. Procedure Code(s):     --- Professional ---                        [phone], Colonoscopy, flexible; with biopsy, single or                         multiple Diagnosis Code(s):     --- Professional ---                        Z12.11, Encounter for  screening for malignant neoplasm                         of colon                        D12.0, Benign neoplasm of cecum CPT copyright 2022 American Medical Association. All rights reserved. The codes documented in this report are preliminary and upon coder review may  be revised to meet current compliance requirements. Lucilla Lame MD, MD 04/20/2022 8:17:52 AM This report has been signed electronically. Number of Addenda: 0 Note Initiated On: 04/20/2022 7:19 AM Scope Withdrawal Time: 0 hours 10 minutes 6 seconds  Total Procedure Duration: 0 hours 17 minutes 11 seconds  Estimated Blood Loss:  Estimated blood loss: none.      [facility]

## 2022-04-20 NOTE — Op Note (Signed)
[facility] Gastroenterology Patient Name: Veronica Kennedy Procedure Date: 04/20/2022 7:20 AM MRN: [id number] Account #: [id number] Date of Birth: 30-Jun-1955 Admit Type: Outpatient Age: 67 Room: [facility] OR ROOM 01 Gender: Female Note Status: Finalized Instrument Name: [id number] Procedure:             Upper GI endoscopy Indications:           Epigastric abdominal pain Providers:             Lucilla Lame MD, MD Referring MD:          Kerin Perna MD, MD (Referring MD) Medicines:             Propofol per Anesthesia Complications:         No immediate complications. Procedure:             Pre-Anesthesia Assessment:                        - Prior to the procedure, a History and Physical was                         performed, and patient medications and allergies were                         reviewed. The patient's tolerance of previous                         anesthesia was also reviewed. The risks and benefits                         of the procedure and the sedation options and risks                         were discussed with the patient. All questions were                         answered, and informed consent was obtained. Prior                         Anticoagulants: The patient has taken no anticoagulant                         or antiplatelet agents. ASA Grade Assessment: II - A                         patient with mild systemic disease. After reviewing                         the risks and benefits, the patient was deemed in                         satisfactory condition to undergo the procedure.                        After obtaining informed consent, the endoscope was                         passed under direct vision. Throughout the procedure,  the patient's blood pressure, pulse, and oxygen                         saturations were monitored continuously. The Endoscope                         was introduced through the mouth, and advanced  to the                         second part of duodenum. The upper GI endoscopy was                         accomplished without difficulty. The patient tolerated                         the procedure well. Findings:      A medium-sized hiatal hernia was present.      Multiple semi-pedunculated polyps with no bleeding and no stigmata of       recent bleeding were found in the gastric fundus and in the gastric body.      The examined duodenum was normal. Impression:            - Medium-sized hiatal hernia.                        - Multiple gastric polyps.                        - Normal examined duodenum.                        - No specimens collected. Recommendation:        - Discharge patient to home.                        - Resume previous diet.                        - Continue present medications.                        - Perform a colonoscopy today. Procedure Code(s):     --- Professional ---                        [phone], Esophagogastroduodenoscopy, flexible,                         transoral; diagnostic, including collection of                         specimen(s) by brushing or washing, when performed                         (separate procedure) Diagnosis Code(s):     --- Professional ---                        R10.13, Epigastric pain CPT copyright 2022 American Medical Association. All rights reserved. The codes documented in this report are preliminary and upon coder review may  be revised to meet current compliance requirements. Lucilla Lame MD, MD 04/20/2022  7:56:38 AM This report has been signed electronically. Number of Addenda: 0 Note Initiated On: 04/20/2022 7:20 AM Total Procedure Duration: 0 hours 2 minutes 28 seconds  Estimated Blood Loss:  Estimated blood loss: none.      [facility]

## 2022-04-20 NOTE — Transfer of Care (Signed)
Immediate Anesthesia Transfer of Care Note  Patient: Veronica Kennedy  Procedure(s) Performed: COLONOSCOPY WITH BIOPSY (Rectum) POLYPECTOMY (Rectum) ESOPHAGOGASTRODUODENOSCOPY (EGD) WITH PROPOFOL (Mouth)  Patient Location: PACU  Anesthesia Type:General  Level of Consciousness: awake, alert , and oriented  Airway & Oxygen Therapy: Patient Spontanous Breathing and Patient connected to nasal cannula oxygen  Post-op Assessment: Report given to RN and Post -op Vital signs reviewed and stable  Post vital signs: stable  Last Vitals:  Vitals Value Taken Time  BP 123/67 04/20/22 0819  Temp    Pulse 93 04/20/22 0822  Resp 17 04/20/22 0822  SpO2 99 % 04/20/22 0822  Vitals shown include unvalidated device data.  Last Pain:  Vitals:   04/20/22 0705  TempSrc: Temporal  PainSc: 0-No pain         Complications: No notable events documented.

## 2022-04-20 NOTE — Anesthesia Postprocedure Evaluation (Signed)
Anesthesia Post Note  Patient: Veronica Kennedy  Procedure(s) Performed: COLONOSCOPY WITH BIOPSY (Rectum) POLYPECTOMY (Rectum) ESOPHAGOGASTRODUODENOSCOPY (EGD) WITH PROPOFOL (Mouth)  Patient location during evaluation: PACU Anesthesia Type: General Level of consciousness: awake and alert Pain management: pain level controlled Vital Signs Assessment: post-procedure vital signs reviewed and stable Respiratory status: spontaneous breathing, nonlabored ventilation, respiratory function stable and patient connected to nasal cannula oxygen Cardiovascular status: blood pressure returned to baseline and stable Postop Assessment: no apparent nausea or vomiting Anesthetic complications: no   No notable events documented.   Last Vitals:  Vitals:   04/20/22 0830 04/20/22 0836  BP: 129/79   Pulse: 85 79  Resp: 18 (!) 21  Temp: (!) 36.3 C   SpO2: 99% 94%    Last Pain:  Vitals:   04/20/22 0830  TempSrc:   PainSc: 0-No pain                 Molli Barrows

## 2022-04-20 NOTE — H&P (Signed)
Lucilla Lame, MD [address] [place],  [address] Phone:[phone] Fax : [phone]  Primary Care Physician:  Hortencia Pilar, MD Primary Gastroenterologist:  Dr. Allen Norris  Pre-Procedure History & Physical: HPI:  DESIRRE Kennedy is a 67 y.o. female is here for an endoscopy and colonoscopy.   Past Medical History:  Diagnosis Date   Arthritis    Asthma    COPD (chronic obstructive pulmonary disease) ([place])    Dyspnea    GERD (gastroesophageal reflux disease)    Hypercholesterolemia    Hypertension    Melanoma ([place])    approximately 8 years ago resected from Right lower leg.    Multiple thyroid nodules    Seizure ([place]) 2013   x 1-unsure ot the cause   Wears dentures    partial upper and lower    Past Surgical History:  Procedure Laterality Date   CATARACT EXTRACTION Bilateral    CHOLECYSTECTOMY     COLONOSCOPY     LUMBAR LAMINECTOMY/DECOMPRESSION MICRODISCECTOMY Right 12/26/2021   Procedure: RIGHT L4-5 LAMINOFORAMINOTOMY;  Surgeon: Meade Maw, MD;  Location: [facility] ORS;  Service: Neurosurgery;  Laterality: Right;   TONSILLECTOMY     TUBAL LIGATION      Prior to Admission medications   Medication Sig Start Date End Date Taking? Authorizing Provider  albuterol (PROVENTIL HFA;VENTOLIN HFA) 108 (90 BASE) MCG/ACT inhaler Inhale 2 puffs into the lungs every 6 (six) hours as needed for wheezing.   Yes [provider]  atorvastatin (LIPITOR) 10 MG tablet Take 1 tablet (10 mg total) by mouth daily. Patient taking differently: Take 10 mg by mouth every morning. 11/08/12  Yes Einar Pheasant, MD  Calcium Carb-Cholecalciferol (CALCIUM 500 + D PO) Take 1 tablet by mouth daily at 6 (six) AM.   Yes [provider]  citalopram (CELEXA) 40 MG tablet Take 1.5 tablets (60 mg total) by mouth daily. Patient taking differently: Take 40 mg by mouth every morning. 09/21/12  Yes Einar Pheasant, MD  cyclobenzaprine (FLEXERIL) 10 MG tablet Take 1 tablet  (10 mg total) by mouth 3 (three) times daily as needed for muscle spasms. Do not drive while taking as can cause drowsiness 02/11/21  Yes Danton Clap, PA-C  Fluticasone-Salmeterol (ADVAIR) 250-50 MCG/DOSE AEPB Inhale 1 puff into the lungs every morning. 05/24/18  Yes [provider]  hyoscyamine (LEVSIN) 0.125 MG tablet Take 0.125 mg by mouth every 4 (four) hours as needed for cramping.   Yes [provider]  pantoprazole (PROTONIX) 40 MG tablet 40 mg 2 (two) times daily. 01/07/18  Yes [provider]  ramipril (ALTACE) 5 MG capsule Take 5 mg by mouth every morning. 10/10/19  Yes [provider]  SUMAtriptan (IMITREX) 25 MG tablet May repeat in 2 hours if headache persists or recurs. Patient taking differently: Take 25 mg by mouth every 2 (two) hours as needed. May repeat in 2 hours if headache persists or recurs. 02/11/21  Yes Danton Clap, PA-C  traZODone (DESYREL) 150 MG tablet TAKE 1 TABLET (150 MG TOTAL) AT BEDTIME Patient taking differently: Take 200 mg by mouth at bedtime. 01/07/13  Yes Einar Pheasant, MD  umeclidinium bromide (INCRUSE ELLIPTA) 62.5 MCG/ACT AEPB Inhale 1 puff into the lungs every morning.   Yes [provider]  esomeprazole (NEXIUM) 40 MG capsule Take 1 capsule (40 mg total) by mouth daily. 12/02/12 10/28/18  Einar Pheasant, MD  fluticasone (FLONASE) 50 MCG/ACT nasal spray Place 2 sprays into both nostrils daily. 07/15/15 10/28/18  Frederich Cha, MD  hydrochlorothiazide (HYDRODIURIL) 25 MG tablet Take 1 tablet (25 mg total) by mouth daily. 09/30/12 04/12/20  Einar Pheasant, MD    Allergies as of 04/03/2022 - Review Complete 04/03/2022  Allergen Reaction Noted   Other  07/21/2020    Family History  Problem Relation Age of Onset   Breast cancer Maternal Aunt 32   Dementia Mother    Heart disease Mother    Diabetes Mother    Heart attack Father    AAA (abdominal aortic aneurysm) Father    Hypertension Father     Hyperlipidemia Father     Social History   Socioeconomic History   Marital status: Married    Spouse name: Not on file   Number of children: Not on file   Years of education: Not on file   Highest education level: Not on file  Occupational History   Not on file  Tobacco Use   Smoking status: Former    Packs/day: 2.00    Types: Cigarettes    Quit date: 06/08/2005    Years since quitting: 16.8   Smokeless tobacco: Never  Vaping Use   Vaping Use: Never used  Substance and Sexual Activity   Alcohol use: Yes    Comment: occasional   Drug use: No   Sexual activity: Not on file  Other Topics Concern   Not on file  Social History Narrative   Not on file   Social Determinants of Health   Financial Resource Strain: Not on file  Food Insecurity: Not on file  Transportation Needs: Not on file  Physical Activity: Not on file  Stress: Not on file  Social Connections: Not on file  Intimate Partner Violence: Not on file    Review of Systems: See HPI, otherwise negative ROS  Physical Exam: BP 126/81   Pulse 83   Temp 98.2 F (36.8 C) (Temporal)   Resp 18   Ht '5\' 4"'$  (1.626 m)   Wt 85.7 kg   SpO2 98%   BMI 32.43 kg/m  General:   Alert,  pleasant and cooperative in NAD Head:  Normocephalic and atraumatic. Neck:  Supple; no masses or thyromegaly. Lungs:  Clear throughout to auscultation.    Heart:  Regular rate and rhythm. Abdomen:  Soft, nontender and nondistended. Normal bowel sounds, without guarding, and without rebound.   Neurologic:  Alert and  oriented x4;  grossly normal neurologically.  Impression/Plan: Veronica Kennedy is here for an endoscopy and colonoscopy to be performed for epigastric pain and screening  Risks, benefits, limitations, and alternatives regarding  endoscopy and colonoscopy have been reviewed with the patient.  Questions have been answered.  All parties agreeable.   Lucilla Lame, MD  04/20/2022, 7:40 AM

## 2022-04-20 NOTE — Anesthesia Preprocedure Evaluation (Signed)
Anesthesia Evaluation  Patient identified by MRN, date of birth, ID band Patient awake    Reviewed: Allergy & Precautions, H&P , NPO status , Patient's Chart, lab work & pertinent test results, reviewed documented beta blocker date and time   Airway Mallampati: II   Neck ROM: full    Dental  (+) Poor Dentition   Pulmonary shortness of breath and with exertion, asthma , COPD,  COPD inhaler, neg recent URI, former smoker   Pulmonary exam normal        Cardiovascular Exercise Tolerance: Poor hypertension, On Medications negative cardio ROS Normal cardiovascular exam Rhythm:regular Rate:Normal     Neuro/Psych Seizures -,   Neuromuscular disease  negative psych ROS   GI/Hepatic Neg liver ROS,GERD  Medicated,,  Endo/Other  negative endocrine ROS    Renal/GU negative Renal ROS  negative genitourinary   Musculoskeletal   Abdominal   Peds  Hematology negative hematology ROS (+)   Anesthesia Other Findings Past Medical History: No date: Arthritis No date: Asthma No date: COPD (chronic obstructive pulmonary disease) (HCC) No date: Dyspnea No date: GERD (gastroesophageal reflux disease) No date: Hypercholesterolemia No date: Hypertension No date: Melanoma ([place])     Comment:  approximately 8 years ago resected from Right lower leg. No date: Multiple thyroid nodules 2013: Seizure ([place])     Comment:  x 1-unsure ot the cause No date: Wears dentures     Comment:  partial upper and lower Past Surgical History: No date: CATARACT EXTRACTION; Bilateral No date: CHOLECYSTECTOMY No date: COLONOSCOPY 12/26/2021: LUMBAR LAMINECTOMY/DECOMPRESSION MICRODISCECTOMY; Right     Comment:  Procedure: RIGHT L4-5 LAMINOFORAMINOTOMY;  Surgeon:               Meade Maw, MD;  Location: [facility];  Service:               Neurosurgery;  Laterality: Right; No date: TONSILLECTOMY No date: TUBAL LIGATION BMI    Body Mass Index: 32.43  kg/m     Reproductive/Obstetrics negative OB ROS                             Anesthesia Physical Anesthesia Plan  ASA: 3  Anesthesia Plan: General   Post-op Pain Management:    Induction:   PONV Risk Score and Plan:   Airway Management Planned:   Additional Equipment:   Intra-op Plan:   Post-operative Plan:   Informed Consent: I have reviewed the patients History and Physical, chart, labs and discussed the procedure including the risks, benefits and alternatives for the proposed anesthesia with the patient or authorized representative who has indicated his/her understanding and acceptance.     Dental Advisory Given  Plan Discussed with: CRNA  Anesthesia Plan Comments:        Anesthesia Quick Evaluation

## 2022-04-21 ENCOUNTER — Encounter: Payer: Self-pay | Admitting: Gastroenterology

## 2022-04-21 LAB — SURGICAL PATHOLOGY

## 2022-04-24 ENCOUNTER — Encounter: Payer: Self-pay | Admitting: Gastroenterology

## 2022-04-29 ENCOUNTER — Ambulatory Visit (INDEPENDENT_AMBULATORY_CARE_PROVIDER_SITE_OTHER): Payer: Medicare HMO

## 2022-04-29 ENCOUNTER — Ambulatory Visit
Admission: RE | Admit: 2022-04-29 | Discharge: 2022-04-29 | Disposition: A | Discharge status: Home / Self Care | Payer: Medicare HMO | Source: Ambulatory Visit | Attending: Family Medicine | Admitting: Family Medicine

## 2022-04-29 VITALS — BP 138/80 | HR 77 | Temp 97.9°F | Resp 14 | Ht 64.0 in | Wt 187.0 lb

## 2022-04-29 DIAGNOSIS — J441 Chronic obstructive pulmonary disease with (acute) exacerbation: Secondary | ICD-10-CM | POA: Diagnosis not present

## 2022-04-29 MED ORDER — DOXYCYCLINE HYCLATE 100 MG PO CAPS: 100.0000 mg | ORAL_CAPSULE | Freq: Two times a day (BID) | ORAL | 0 refills | Status: AC

## 2022-04-29 MED ORDER — CHERATUSSIN AC 100-10 MG/5ML PO SOLN: 5.0000 mL | Freq: Three times a day (TID) | ORAL | 0 refills | Status: DC | PRN

## 2022-04-29 MED ORDER — BENZONATATE 100 MG PO CAPS: 200.0000 mg | ORAL_CAPSULE | Freq: Three times a day (TID) | ORAL | 0 refills | Status: DC | PRN

## 2022-04-29 NOTE — ED Triage Notes (Signed)
Patient c/o cough and congestion for that past 5-6 weeks.  Patient reports history of COPD.  Patient denies fevers.

## 2022-04-29 NOTE — Discharge Instructions (Addendum)
Stop by the pharmacy to pick up your prescriptions.  Take 50 mg prednisone for 5 days. Follow up with your primary care provider as needed.  Go to ED for red flag symptoms, including; fevers you cannot reduce with Tylenol/Motrin, severe headaches, vision changes, numbness/weakness in part of the body, lethargy, confusion, intractable vomiting, severe dehydration, chest pain, breathing difficulty, severe persistent abdominal or pelvic pain, signs of severe infection (increased redness, swelling of an area), feeling faint or passing out, dizziness, etc. You should especially go to the ED for sudden acute worsening of condition if you do not elect to go at this time.

## 2022-04-29 NOTE — ED Provider Notes (Signed)
[facility]    CSN: [id number] Arrival date & time: 04/29/22  1333      History   Chief Complaint Chief Complaint  Patient presents with   Cough    Appointment    HPI Veronica Kennedy is a 67 y.o. female.   HPI   Veronica Kennedy presents for persistent productive cough for the past 6-7 weeks. She has yellow-white sputum without blood. Has COPD and quit smoking more than 10 years ago. She reports when her symptoms first started she and her husband was sick but her husband got better and she is not.  She has some intermittent chest tightness and shortness of breath.  Denies dyspnea with exertion.  Has been no recent fever, nasal congestion, nausea, vomiting, diarrhea or chest discomfort.  The cough is keeping her up at night.      Past Medical History:  Diagnosis Date   Arthritis    Asthma    COPD (chronic obstructive pulmonary disease) ([place])    Dyspnea    GERD (gastroesophageal reflux disease)    Hypercholesterolemia    Hypertension    Melanoma ([place])    approximately 8 years ago resected from Right lower leg.    Multiple thyroid nodules    Seizure ([place]) 2013   x 1-unsure ot the cause   Wears dentures    partial upper and lower    Patient Active Problem List   Diagnosis Date Noted   Special screening for malignant neoplasms, colon 04/20/2022   Polyp of colon 04/20/2022   Epigastric pain 04/20/2022   Lumbar radiculopathy    GERD (gastroesophageal reflux disease) 09/21/2012   Stress 09/21/2012   Essential hypertension, benign 09/19/2012   Hypercholesterolemia 09/19/2012    Past Surgical History:  Procedure Laterality Date   BACK SURGERY     CATARACT EXTRACTION Bilateral    CHOLECYSTECTOMY     COLONOSCOPY     COLONOSCOPY WITH PROPOFOL N/A 04/20/2022   Procedure: COLONOSCOPY WITH BIOPSY;  Surgeon: Lucilla Lame, MD;  Location: [place];  Service: Endoscopy;  Laterality: N/A;   ESOPHAGOGASTRODUODENOSCOPY (EGD) WITH PROPOFOL N/A 04/20/2022    Procedure: ESOPHAGOGASTRODUODENOSCOPY (EGD) WITH PROPOFOL;  Surgeon: Lucilla Lame, MD;  Location: [place];  Service: Endoscopy;  Laterality: N/A;   LUMBAR LAMINECTOMY/DECOMPRESSION MICRODISCECTOMY Right 12/26/2021   Procedure: RIGHT L4-5 LAMINOFORAMINOTOMY;  Surgeon: Meade Maw, MD;  Location: [facility];  Service: Neurosurgery;  Laterality: Right;   POLYPECTOMY N/A 04/20/2022   Procedure: POLYPECTOMY;  Surgeon: Lucilla Lame, MD;  Location: [place];  Service: Endoscopy;  Laterality: N/A;   TONSILLECTOMY     TUBAL LIGATION      OB History   No obstetric history on file.      Home Medications    Prior to Admission medications   Medication Sig Start Date End Date Taking? Authorizing Provider  benzonatate (TESSALON) 100 MG capsule Take 2 capsules (200 mg total) by mouth 3 (three) times daily as needed for cough. 04/29/22  Yes Yilin Weedon, DO  doxycycline (VIBRAMYCIN) 100 MG capsule Take 1 capsule (100 mg total) by mouth 2 (two) times daily for 5 days. 04/29/22 05/04/22 Yes Orion Vandervort, DO  guaiFENesin-codeine (CHERATUSSIN AC) 100-10 MG/5ML syrup Take 5 mLs by mouth 3 (three) times daily as needed for cough. 04/29/22  Yes Bocephus Cali, DO  albuterol (PROVENTIL HFA;VENTOLIN HFA) 108 (90 BASE) MCG/ACT inhaler Inhale 2 puffs into the lungs every 6 (six) hours as needed for wheezing.    [provider]  atorvastatin (LIPITOR) 10 MG tablet Take 1 tablet (10 mg total) by mouth daily. Patient taking differently: Take 10 mg by mouth every morning. 11/08/12   Einar Pheasant, MD  Calcium Carb-Cholecalciferol (CALCIUM 500 + D PO) Take 1 tablet by mouth daily at 6 (six) AM.    [provider]  citalopram (CELEXA) 40 MG tablet Take 1.5 tablets (60 mg total) by mouth daily. Patient taking differently: Take 40 mg by mouth every morning. 09/21/12   Einar Pheasant, MD  cyclobenzaprine (FLEXERIL) 10 MG tablet Take 1 tablet (10 mg total) by mouth 3 (three)  times daily as needed for muscle spasms. Do not drive while taking as can cause drowsiness 02/11/21   Danton Clap, PA-C  Fluticasone-Salmeterol (ADVAIR) 250-50 MCG/DOSE AEPB Inhale 1 puff into the lungs every morning. 05/24/18   [provider]  hyoscyamine (LEVSIN) 0.125 MG tablet Take 0.125 mg by mouth every 4 (four) hours as needed for cramping.    [provider]  pantoprazole (PROTONIX) 40 MG tablet 40 mg 2 (two) times daily. 01/07/18   [provider]  ramipril (ALTACE) 5 MG capsule Take 5 mg by mouth every morning. 10/10/19   [provider]  SUMAtriptan (IMITREX) 25 MG tablet May repeat in 2 hours if headache persists or recurs. Patient taking differently: Take 25 mg by mouth every 2 (two) hours as needed. May repeat in 2 hours if headache persists or recurs. 02/11/21   Danton Clap, PA-C  traZODone (DESYREL) 150 MG tablet TAKE 1 TABLET (150 MG TOTAL) AT BEDTIME Patient taking differently: Take 200 mg by mouth at bedtime. 01/07/13   Einar Pheasant, MD  umeclidinium bromide (INCRUSE ELLIPTA) 62.5 MCG/ACT AEPB Inhale 1 puff into the lungs every morning.    [provider]  esomeprazole (NEXIUM) 40 MG capsule Take 1 capsule (40 mg total) by mouth daily. 12/02/12 10/28/18  Einar Pheasant, MD  fluticasone (FLONASE) 50 MCG/ACT nasal spray Place 2 sprays into both nostrils daily. 07/15/15 10/28/18  Frederich Cha, MD  hydrochlorothiazide (HYDRODIURIL) 25 MG tablet Take 1 tablet (25 mg total) by mouth daily. 09/30/12 04/12/20  Einar Pheasant, MD    Family History Family History  Problem Relation Age of Onset   Breast cancer Maternal Aunt 56   Dementia Mother    Heart disease Mother    Diabetes Mother    Heart attack Father    AAA (abdominal aortic aneurysm) Father    Hypertension Father    Hyperlipidemia Father     Social History Social History   Tobacco Use   Smoking status: Former    Packs/day: 2.00    Types: Cigarettes    Quit date:  06/08/2005    Years since quitting: 16.9   Smokeless tobacco: Never  Vaping Use   Vaping Use: Never used  Substance Use Topics   Alcohol use: Yes    Comment: occasional   Drug use: No     Allergies   Other   Review of Systems Review of Systems: negative unless otherwise stated in HPI.      Physical Exam Triage Vital Signs ED Triage Vitals  Enc Vitals Group     BP 04/29/22 1343 138/80     Pulse Rate 04/29/22 1343 77     Resp 04/29/22 1343 14     Temp 04/29/22 1343 97.9 F (36.6 C)     Temp Source 04/29/22 1343 Oral     SpO2 04/29/22 1343 98 %     Weight 04/29/22  1340 187 lb (84.8 kg)     Height 04/29/22 1340 '5\' 4"'$  (1.626 m)     Head Circumference --      Peak Flow --      Pain Score 04/29/22 1340 0     Pain Loc --      Pain Edu? --      Excl. in [place]? --    No data found.  Updated Vital Signs BP 138/80 (BP Location: Right Arm)   Pulse 77   Temp 97.9 F (36.6 C) (Oral)   Resp 14   Ht '5\' 4"'$  (1.626 m)   Wt 84.8 kg   SpO2 98%   BMI 32.10 kg/m   Visual Acuity Right Eye Distance:   Left Eye Distance:   Bilateral Distance:    Right Eye Near:   Left Eye Near:    Bilateral Near:     Physical Exam GEN:     alert, non-toxic appearing female in no distress    HENT:  mucus membranes moist, no nasal discharge EYES:   wears glasses, no scleral injection or discharge NECK:  normal ROM, no lymphadenopathy, no meningismus   RESP:  no increased work of breathing, expiratory wheezing, rhonchi without rales  CVS:   regular rate and rhythm Skin:   warm and dry,    UC Treatments / Results  Labs (all labs ordered are listed, but only abnormal results are displayed) Labs Reviewed - No data to display  EKG   Radiology DG Chest 2 View  Result Date: 04/29/2022 CLINICAL DATA:  cough and congestion for 5-6 weeks EXAM: CHEST - 2 VIEW COMPARISON:  March 23, 2021 FINDINGS: The cardiomediastinal silhouette is unchanged in contour. No pleural effusion. No  pneumothorax. No acute pleuroparenchymal abnormality. Visualized abdomen is unremarkable. Minimal degenerative changes of the thoracic spine. IMPRESSION: No acute cardiopulmonary abnormality. Electronically Signed   By: Valentino Saxon M.D.   On: 04/29/2022 14:00    Procedures Procedures (including critical care time)  Medications Ordered in UC Medications - No data to display  Initial Impression / Assessment and Plan / UC Course  I have reviewed the triage vital signs and the nursing notes.  Pertinent labs & imaging results that were available during my care of the patient were reviewed by me and considered in my medical decision making (see chart for details).       Pt is a 67 y.o. female with COPD who presents for 6-7 weeks of cough that is not improving.  Veronica Kennedy is  afebrile here without recent antipyretics. Satting well, on room air. Overall pt is  non-toxic appearing, well hydrated, without respiratory distress. Pulmonary exam is remarkable for expiratory wheezing, rhonchi without rales .  After shared decision making, we will pursue chest x-ray. COVID  and influenza testing deferred due to length of symptoms.   Chest xray personally reviewed by me without focal pneumonia, pleural effusion, cardiomegaly or pneumothorax.  Treat acute bronchitis with antibiotics as below. Pt states she has a bottle of steroids at home that she will take.  Advised prednisone 50 mg for 5 days. Tessalon Perles and Cheratussin cough syrup given for cough and allow patient to rest.  Typical duration of symptoms discussed. Return and ED precautions given and patient voiced understanding.   Discussed MDM, treatment plan and plan for follow-up with patient who agrees with plan.      Final Clinical Impressions(s) / UC Diagnoses   Final diagnoses:  COPD exacerbation ([place])  Discharge Instructions      Stop by the pharmacy to pick up your prescriptions.  Take 50 mg prednisone for 5 days. Follow up  with your primary care provider as needed.  Go to ED for red flag symptoms, including; fevers you cannot reduce with Tylenol/Motrin, severe headaches, vision changes, numbness/weakness in part of the body, lethargy, confusion, intractable vomiting, severe dehydration, chest pain, breathing difficulty, severe persistent abdominal or pelvic pain, signs of severe infection (increased redness, swelling of an area), feeling faint or passing out, dizziness, etc. You should especially go to the ED for sudden acute worsening of condition if you do not elect to go at this time.       ED Prescriptions     Medication Sig Dispense Auth. Provider   doxycycline (VIBRAMYCIN) 100 MG capsule Take 1 capsule (100 mg total) by mouth 2 (two) times daily for 5 days. 10 capsule Marguerite Jarboe, DO   benzonatate (TESSALON) 100 MG capsule Take 2 capsules (200 mg total) by mouth 3 (three) times daily as needed for cough. 21 capsule Muaad Boehning, DO   guaiFENesin-codeine (CHERATUSSIN AC) 100-10 MG/5ML syrup Take 5 mLs by mouth 3 (three) times daily as needed for cough. 120 mL Yasser Hepp, Ronnette Juniper, DO      I have reviewed the PDMP during this encounter.   Lyndee Hensen, DO 04/29/22 1424

## 2022-05-01 ENCOUNTER — Ambulatory Visit
Admission: RE | Admit: 2022-05-01 | Discharge: 2022-05-01 | Disposition: A | Discharge status: Home / Self Care | Payer: Medicare HMO | Source: Ambulatory Visit | Attending: Family Medicine | Admitting: Family Medicine

## 2022-05-01 DIAGNOSIS — Z1231 Encounter for screening mammogram for malignant neoplasm of breast: Secondary | ICD-10-CM | POA: Diagnosis present

## 2022-05-29 ENCOUNTER — Ambulatory Visit: Payer: Medicare HMO | Admitting: Gastroenterology

## 2022-07-20 ENCOUNTER — Ambulatory Visit
Admission: RE | Admit: 2022-07-20 | Discharge: 2022-07-20 | Disposition: A | Discharge status: Home / Self Care | Payer: Medicare HMO | Source: Ambulatory Visit | Attending: Physician Assistant | Admitting: Physician Assistant

## 2022-07-20 ENCOUNTER — Other Ambulatory Visit: Payer: Self-pay

## 2022-07-20 VITALS — BP 121/84 | HR 82 | Temp 98.7°F | Resp 20

## 2022-07-20 DIAGNOSIS — H00014 Hordeolum externum left upper eyelid: Secondary | ICD-10-CM | POA: Diagnosis not present

## 2022-07-20 MED ORDER — ERYTHROMYCIN 5 MG/GM OP OINT: TOPICAL_OINTMENT | OPHTHALMIC | 0 refills | Status: AC

## 2022-07-20 NOTE — ED Provider Notes (Signed)
[facility]    CSN: [id number] Arrival date & time: 07/20/22  [id number]      History   Chief Complaint Chief Complaint  Patient presents with   Eye Problem    Entered by patient   stye to eyelid    Pt believes she has a stye on left eyelid    HPI Veronica Kennedy is a 67 y.o. female presenting for left upper eyelid irritation for the past couple of weeks. Believes has a stye.  There is no associated swelling.  No drainage.  The actual eye is not injected.  No eye pain or vision changes.  Has been applying warm compresses for the past couple days.  HPI  Past Medical History:  Diagnosis Date   Arthritis    Asthma    COPD (chronic obstructive pulmonary disease)    Dyspnea    GERD (gastroesophageal reflux disease)    Hypercholesterolemia    Hypertension    Melanoma    approximately 8 years ago resected from Right lower leg.    Multiple thyroid nodules    Seizure 2013   x 1-unsure ot the cause   Wears dentures    partial upper and lower    Patient Active Problem List   Diagnosis Date Noted   Special screening for malignant neoplasms, colon 04/20/2022   Polyp of colon 04/20/2022   Epigastric pain 04/20/2022   Lumbar radiculopathy    GERD (gastroesophageal reflux disease) 09/21/2012   Stress 09/21/2012   Essential hypertension, benign 09/19/2012   Hypercholesterolemia 09/19/2012    Past Surgical History:  Procedure Laterality Date   BACK SURGERY     CATARACT EXTRACTION Bilateral    CHOLECYSTECTOMY     COLONOSCOPY     COLONOSCOPY WITH PROPOFOL N/A 04/20/2022   Procedure: COLONOSCOPY WITH BIOPSY;  Surgeon: Lucilla Lame, MD;  Location: [place];  Service: Endoscopy;  Laterality: N/A;   ESOPHAGOGASTRODUODENOSCOPY (EGD) WITH PROPOFOL N/A 04/20/2022   Procedure: ESOPHAGOGASTRODUODENOSCOPY (EGD) WITH PROPOFOL;  Surgeon: Lucilla Lame, MD;  Location: [place];  Service: Endoscopy;  Laterality: N/A;   LUMBAR LAMINECTOMY/DECOMPRESSION  MICRODISCECTOMY Right 12/26/2021   Procedure: RIGHT L4-5 LAMINOFORAMINOTOMY;  Surgeon: Meade Maw, MD;  Location: [facility];  Service: Neurosurgery;  Laterality: Right;   POLYPECTOMY N/A 04/20/2022   Procedure: POLYPECTOMY;  Surgeon: Lucilla Lame, MD;  Location: [place];  Service: Endoscopy;  Laterality: N/A;   TONSILLECTOMY     TUBAL LIGATION      OB History   No obstetric history on file.      Home Medications    Prior to Admission medications   Medication Sig Start Date End Date Taking? Authorizing Provider  erythromycin ophthalmic ointment Place a 1/2 inch ribbon of ointment into the upper eyelid q6h. 07/20/22 07/27/22 Yes Laurene Footman B, PA-C  albuterol (PROVENTIL HFA;VENTOLIN HFA) 108 (90 BASE) MCG/ACT inhaler Inhale 2 puffs into the lungs every 6 (six) hours as needed for wheezing.    [provider]  atorvastatin (LIPITOR) 10 MG tablet Take 1 tablet (10 mg total) by mouth daily. Patient taking differently: Take 10 mg by mouth every morning. 11/08/12   Einar Pheasant, MD  benzonatate (TESSALON) 100 MG capsule Take 2 capsules (200 mg total) by mouth 3 (three) times daily as needed for cough. 04/29/22   Lyndee Hensen, DO  Calcium Carb-Cholecalciferol (CALCIUM 500 + D PO) Take 1 tablet by mouth daily at 6 (six) AM.    [provider]  citalopram (CELEXA) 40  MG tablet Take 1.5 tablets (60 mg total) by mouth daily. Patient taking differently: Take 40 mg by mouth every morning. 09/21/12   Einar Pheasant, MD  cyclobenzaprine (FLEXERIL) 10 MG tablet Take 1 tablet (10 mg total) by mouth 3 (three) times daily as needed for muscle spasms. Do not drive while taking as can cause drowsiness 02/11/21   Danton Clap, PA-C  Fluticasone-Salmeterol (ADVAIR) 250-50 MCG/DOSE AEPB Inhale 1 puff into the lungs every morning. 05/24/18   [provider]  guaiFENesin-codeine (CHERATUSSIN AC) 100-10 MG/5ML syrup Take 5 mLs by mouth 3 (three) times daily as needed  for cough. 04/29/22   Brimage, Ronnette Juniper, DO  hyoscyamine (LEVSIN) 0.125 MG tablet Take 0.125 mg by mouth every 4 (four) hours as needed for cramping.    [provider]  pantoprazole (PROTONIX) 40 MG tablet 40 mg 2 (two) times daily. 01/07/18   [provider]  ramipril (ALTACE) 5 MG capsule Take 5 mg by mouth every morning. 10/10/19   [provider]  SUMAtriptan (IMITREX) 25 MG tablet May repeat in 2 hours if headache persists or recurs. Patient taking differently: Take 25 mg by mouth every 2 (two) hours as needed. May repeat in 2 hours if headache persists or recurs. 02/11/21   Danton Clap, PA-C  traZODone (DESYREL) 150 MG tablet TAKE 1 TABLET (150 MG TOTAL) AT BEDTIME Patient taking differently: Take 200 mg by mouth at bedtime. 01/07/13   Einar Pheasant, MD  umeclidinium bromide (INCRUSE ELLIPTA) 62.5 MCG/ACT AEPB Inhale 1 puff into the lungs every morning.    [provider]  esomeprazole (NEXIUM) 40 MG capsule Take 1 capsule (40 mg total) by mouth daily. 12/02/12 10/28/18  Einar Pheasant, MD  fluticasone (FLONASE) 50 MCG/ACT nasal spray Place 2 sprays into both nostrils daily. 07/15/15 10/28/18  Frederich Cha, MD  hydrochlorothiazide (HYDRODIURIL) 25 MG tablet Take 1 tablet (25 mg total) by mouth daily. 09/30/12 04/12/20  Einar Pheasant, MD    Family History Family History  Problem Relation Age of Onset   Breast cancer Maternal Aunt 53   Dementia Mother    Heart disease Mother    Diabetes Mother    Heart attack Father    AAA (abdominal aortic aneurysm) Father    Hypertension Father    Hyperlipidemia Father     Social History Social History   Tobacco Use   Smoking status: Former    Packs/day: 2    Types: Cigarettes    Quit date: 06/08/2005    Years since quitting: 17.1   Smokeless tobacco: Never  Vaping Use   Vaping Use: Never used  Substance Use Topics   Alcohol use: Yes    Comment: occasional   Drug use: No     Allergies    Other   Review of Systems Review of Systems  Constitutional:  Negative for fatigue and fever.  HENT:  Negative for congestion and facial swelling.   Eyes:  Positive for itching. Negative for photophobia, pain, discharge, redness and visual disturbance.  Skin:  Negative for rash.  Neurological:  Negative for headaches.     Physical Exam Triage Vital Signs ED Triage Vitals  Enc Vitals Group     BP 07/20/22 1001 121/84     Pulse Rate 07/20/22 1001 82     Resp 07/20/22 1001 20     Temp 07/20/22 1001 98.7 F (37.1 C)     Temp src --      SpO2 07/20/22 1001 100 %  Weight --      Height --      Head Circumference --      Peak Flow --      Pain Score 07/20/22 1002 5     Pain Loc --      Pain Edu? --      Excl. in [place]? --    No data found.  Updated Vital Signs BP 121/84   Pulse 82   Temp 98.7 F (37.1 C)   Resp 20   SpO2 100%   Visual Acuity Right Eye Distance:   Left Eye Distance:   Bilateral Distance:    Physical Exam Vitals and nursing note reviewed.  Constitutional:      General: She is not in acute distress.    Appearance: Normal appearance. She is not ill-appearing or toxic-appearing.  HENT:     Head: Normocephalic and atraumatic.     Nose: Nose normal.     Mouth/Throat:     Mouth: Mucous membranes are moist.     Pharynx: Oropharynx is clear.  Eyes:     General: No scleral icterus.       Right eye: No discharge.        Left eye: No discharge.     Conjunctiva/sclera: Conjunctivae normal.     Right eye: Right conjunctiva is not injected.     Left eye: Left conjunctiva is not injected.     Comments: Tiny non erythematous bump inner left upper eyelid  Cardiovascular:     Rate and Rhythm: Normal rate and regular rhythm.  Pulmonary:     Effort: Pulmonary effort is normal. No respiratory distress.  Musculoskeletal:     Cervical back: Neck supple.  Skin:    General: Skin is dry.  Neurological:     General: No focal deficit present.     Mental  Status: She is alert. Mental status is at baseline.     Motor: No weakness.     Gait: Gait normal.  Psychiatric:        Mood and Affect: Mood normal.        Behavior: Behavior normal.        Thought Content: Thought content normal.      UC Treatments / Results  Labs (all labs ordered are listed, but only abnormal results are displayed) Labs Reviewed - No data to display  EKG   Radiology No results found.  Procedures Procedures (including critical care time)  Medications Ordered in UC Medications - No data to display  Initial Impression / Assessment and Plan / UC Course  I have reviewed the triage vital signs and the nursing notes.  Pertinent labs & imaging results that were available during my care of the patient were reviewed by me and considered in my medical decision making (see chart for details).   67 year old female presents for left upper eyelid irritation for the past couple weeks.  Believes she has a stye.  On exam there is a very tiny nonerythematous bump of the inner left upper eyelid without erythema.  No drainage.  Does not appear to be tender.  Possibly consistent with stye.  Given patient's high concern and the fact that there has been irritation for the past couple weeks, we will treat her with erythromycin ophthalmic ointment.  Advised her to continue with use of the warm compresses.  Reviewed following up with PCP if no improvement in the next week or 2 or symptoms worsen.   Final Clinical Impressions(s) / UC  Diagnoses   Final diagnoses:  Hordeolum externum of left upper eyelid   Discharge Instructions   None    ED Prescriptions     Medication Sig Dispense Auth. Provider   erythromycin ophthalmic ointment Place a 1/2 inch ribbon of ointment into the upper eyelid q6h. 3.5 g Danton Clap, PA-C      PDMP not reviewed this encounter.   Danton Clap, PA-C 07/20/22 1028

## 2022-07-20 NOTE — ED Triage Notes (Signed)
Pt believes she has a stye on left eyelid. Pt c/o eye irritation and bump. Pt states symptoms started 2wks ago.

## 2023-03-05 ENCOUNTER — Other Ambulatory Visit: Payer: Self-pay | Admitting: Family Medicine

## 2023-03-05 DIAGNOSIS — Z1231 Encounter for screening mammogram for malignant neoplasm of breast: Secondary | ICD-10-CM

## 2023-03-13 ENCOUNTER — Ambulatory Visit: Payer: Self-pay

## 2023-04-22 ENCOUNTER — Ambulatory Visit
Admission: RE | Admit: 2023-04-22 | Discharge: 2023-04-22 | Disposition: A | Discharge status: Home / Self Care | Payer: Medicare HMO | Source: Ambulatory Visit | Attending: Emergency Medicine | Admitting: Emergency Medicine

## 2023-04-22 VITALS — BP 130/91 | HR 95 | Temp 98.2°F | Resp 14 | Ht 64.0 in | Wt 186.9 lb

## 2023-04-22 DIAGNOSIS — B9689 Other specified bacterial agents as the cause of diseases classified elsewhere: Secondary | ICD-10-CM | POA: Diagnosis not present

## 2023-04-22 DIAGNOSIS — N760 Acute vaginitis: Secondary | ICD-10-CM | POA: Diagnosis not present

## 2023-04-22 LAB — URINALYSIS, W/ REFLEX TO CULTURE (INFECTION SUSPECTED)
Bilirubin Urine: NEGATIVE
Glucose, UA: NEGATIVE mg/dL
Hgb urine dipstick: NEGATIVE
Ketones, ur: NEGATIVE mg/dL
Leukocytes,Ua: NEGATIVE
Nitrite: NEGATIVE
Protein, ur: NEGATIVE mg/dL
Specific Gravity, Urine: 1.01 (ref 1.005–1.030)
pH: 6.5 (ref 5.0–8.0)

## 2023-04-22 LAB — WET PREP, GENITAL
Sperm: NONE SEEN
Trich, Wet Prep: NONE SEEN
WBC, Wet Prep HPF POC: <10 — AB (ref <10)
Yeast Wet Prep HPF POC: NONE SEEN

## 2023-04-22 MED ORDER — METRONIDAZOLE 500 MG PO TABS: 500.0000 mg | ORAL_TABLET | Freq: Two times a day (BID) | ORAL | 0 refills | Status: AC

## 2023-04-22 MED ORDER — IBUPROFEN 600 MG PO TABS: 600.0000 mg | ORAL_TABLET | Freq: Three times a day (TID) | ORAL | 0 refills | Status: AC | PRN

## 2023-04-22 NOTE — ED Triage Notes (Signed)
 Patient c/o lower abdominal pain and pressure that started yesterday morning.  Patient reports odor.  Patient denies urinary urgency.  Patient denies N/V/D.

## 2023-04-22 NOTE — Discharge Instructions (Addendum)
 Wet prep was positive for bacterial vaginosis.  Finish the Flagyl , even if you feel better.  Do not drink alcohol while taking the Flagyl .  You can take ibuprofen  with 1000 mg of Tylenol  3 times a day as needed for pain.  Urinalysis is not completely diagnostic of a urinary tract infection.  I am sending your urine off for culture to make sure that you do not have a UTI.  If it comes back positive for UTI, we will contact you and start you on the appropriate antibiotic.  In the meantime,  Push extra fluids.

## 2023-04-22 NOTE — ED Provider Notes (Signed)
 HPI  SUBJECTIVE:  Veronica Kennedy is a 68 y.o. female who presents with constant, nonmigratory, nonradiating stabbing low midline pelvic pain starting yesterday.  She reports a vaginal odor and odorous urine.  No nausea, vomiting, fevers, other urinary complaints, vaginal discharge, itching, rash, back pain, anorexia.  The car ride over here was not painful.  She had a normal bowel movement earlier today.  She has had symptoms like this before, but was never evaluated.  She is in a long-term monogamous with a husband of 25 years, who is asymptomatic.  No antipyretic in the past 6 hours.  She tried rest and Tylenol .  Rest helps.  Symptoms are worse with stooling.  It is not associated with urination, p.o. intake, movement. Patient has a past medical history of asthma/COPD, GERD, hypercholesterolemia, hypertension, status post cholecystectomy, hypothyroidism status post partial thyroid  resection, UTI, BV, yeast infection.  She had a normal colonoscopy last year.  No history of diabetes, pyelonephritis, nephrolithiasis, PID.  PCP: [facility]  Past Medical History:  Diagnosis Date   Arthritis    Asthma    COPD (chronic obstructive pulmonary disease) (HCC)    Dyspnea    GERD (gastroesophageal reflux disease)    Hypercholesterolemia    Hypertension    Melanoma (HCC)    approximately 8 years ago resected from Right lower leg.    Multiple thyroid  nodules    Seizure (HCC) 2013   x 1-unsure ot the cause   Wears dentures    partial upper and lower    Past Surgical History:  Procedure Laterality Date   BACK SURGERY     CATARACT EXTRACTION Bilateral    CHOLECYSTECTOMY     COLONOSCOPY     COLONOSCOPY WITH PROPOFOL  N/A 04/20/2022   Procedure: COLONOSCOPY WITH BIOPSY;  Surgeon: Jinny Carmine, MD;  Location: [facility];  Service: Endoscopy;  Laterality: N/A;   ESOPHAGOGASTRODUODENOSCOPY (EGD) WITH PROPOFOL  N/A 04/20/2022   Procedure: ESOPHAGOGASTRODUODENOSCOPY (EGD) WITH PROPOFOL ;   Surgeon: Jinny Carmine, MD;  Location: [facility];  Service: Endoscopy;  Laterality: N/A;   LUMBAR LAMINECTOMY/DECOMPRESSION MICRODISCECTOMY Right 12/26/2021   Procedure: RIGHT L4-5 LAMINOFORAMINOTOMY;  Surgeon: Clois Fret, MD;  Location: [facility];  Service: Neurosurgery;  Laterality: Right;   POLYPECTOMY N/A 04/20/2022   Procedure: POLYPECTOMY;  Surgeon: Jinny Carmine, MD;  Location: [facility];  Service: Endoscopy;  Laterality: N/A;   TONSILLECTOMY     TUBAL LIGATION      Family History  Problem Relation Age of Onset   Breast cancer Maternal Aunt 2   Dementia Mother    Heart disease Mother    Diabetes Mother    Heart attack Father    AAA (abdominal aortic aneurysm) Father    Hypertension Father    Hyperlipidemia Father     Social History   Tobacco Use   Smoking status: Former    Current packs/day: 0.00    Types: Cigarettes    Quit date: 06/08/2005    Years since quitting: 17.8   Smokeless tobacco: Never  Vaping Use   Vaping status: Never Used  Substance Use Topics   Alcohol use: Yes    Comment: occasional   Drug use: No    No current facility-administered medications for this encounter.  Current Outpatient Medications:    atorvastatin  (LIPITOR) 10 MG tablet, Take 1 tablet (10 mg total) by mouth daily. (Patient taking differently: Take 10 mg by mouth every morning.), Disp: 90 tablet, Rfl: 1   Calcium  Carb-Cholecalciferol (CALCIUM  500 +  D PO), Take 1 tablet by mouth daily at 6 (six) AM., Disp: , Rfl:    citalopram  (CELEXA ) 40 MG tablet, Take 1.5 tablets (60 mg total) by mouth daily. (Patient taking differently: Take 40 mg by mouth every morning.), Disp: 135 tablet, Rfl: 1   Fluticasone -Salmeterol (ADVAIR) 250-50 MCG/DOSE AEPB, Inhale 1 puff into the lungs every morning., Disp: , Rfl:    ibuprofen  (ADVIL ) 600 MG tablet, Take 1 tablet (600 mg total) by mouth every 8 (eight) hours as needed., Disp: 30 tablet, Rfl: 0   metroNIDAZOLE  (FLAGYL ) 500 MG  tablet, Take 1 tablet (500 mg total) by mouth 2 (two) times daily for 7 days., Disp: 14 tablet, Rfl: 0   pantoprazole  (PROTONIX ) 40 MG tablet, 40 mg 2 (two) times daily., Disp: , Rfl:    ramipril  (ALTACE ) 5 MG capsule, Take 5 mg by mouth every morning., Disp: , Rfl:    traZODone  (DESYREL ) 150 MG tablet, TAKE 1 TABLET (150 MG TOTAL) AT BEDTIME (Patient taking differently: Take 200 mg by mouth at bedtime.), Disp: 90 tablet, Rfl: 0   umeclidinium bromide (INCRUSE ELLIPTA) 62.5 MCG/ACT AEPB, Inhale 1 puff into the lungs every morning., Disp: , Rfl:    albuterol  (PROVENTIL  HFA;VENTOLIN  HFA) 108 (90 BASE) MCG/ACT inhaler, Inhale 2 puffs into the lungs every 6 (six) hours as needed for wheezing., Disp: , Rfl:    Cholecalciferol (D 1000) 25 MCG (1000 UT) capsule, Take by mouth., Disp: , Rfl:    cyanocobalamin (VITAMIN B12) 1000 MCG tablet, Take by mouth., Disp: , Rfl:    cyclobenzaprine  (FLEXERIL ) 10 MG tablet, Take 1 tablet (10 mg total) by mouth 3 (three) times daily as needed for muscle spasms. Do not drive while taking as can cause drowsiness, Disp: 30 tablet, Rfl: 0   hyoscyamine (LEVSIN) 0.125 MG tablet, Take 0.125 mg by mouth every 4 (four) hours as needed for cramping., Disp: , Rfl:    SUMAtriptan  (IMITREX ) 25 MG tablet, May repeat in 2 hours if headache persists or recurs. (Patient taking differently: Take 25 mg by mouth every 2 (two) hours as needed. May repeat in 2 hours if headache persists or recurs.), Disp: 10 tablet, Rfl: 0  Allergies  Allergen Reactions   Other     Other reaction(s): Other (See Comments) Seasonal allergies causing sinus congestion     ROS  As noted in HPI.   Physical Exam  BP (!) 130/91 (BP Location: Left Arm)   Pulse 95   Temp 98.2 F (36.8 C) (Oral)   Resp 14   Ht 5' 4 (1.626 m)   Wt 84.8 kg   SpO2 97%   BMI 32.09 kg/m   Constitutional: Well developed, well nourished, no acute distress.  Moving around comfortably. Eyes:  EOMI, conjunctiva normal  bilaterally HENT: Normocephalic, atraumatic,mucus membranes moist Respiratory: Normal inspiratory effort Cardiovascular: Normal rate GI: nondistended.  Soft, positive suprapubic, mild lower flank tenderness bilaterally.  No lower quadrant tenderness.  No other abdominal tenderness.  No rebound, guarding.  Active bowel sounds. Back: No CVAT skin: No rash, skin intact Musculoskeletal: no deformities Neurologic: Alert & oriented x 3, no focal neuro deficits Psychiatric: Speech and behavior appropriate   ED Course   Medications - No data to display  Orders Placed This Encounter  Procedures   Wet prep, genital    Standing Status:   Standing    Number of Occurrences:   1   Urinalysis, w/ Reflex to Culture (Infection Suspected) -Urine, Clean Catch  Standing Status:   Standing    Number of Occurrences:   1    Specimen Source:   Urine, Clean Catch [[id number]]    Results for orders placed or performed during the hospital encounter of 04/22/23 (from the past 24 hours)  Urinalysis, w/ Reflex to Culture (Infection Suspected) -Urine, Clean Catch     Status: Abnormal   Collection Time: 04/22/23  9:30 AM  Result Value Ref Range   Specimen Source URINE, CLEAN CATCH    Color, Urine YELLOW YELLOW   APPearance CLEAR CLEAR   Specific Gravity, Urine 1.010 1.005 - 1.030   pH 6.5 5.0 - 8.0   Glucose, UA NEGATIVE NEGATIVE mg/dL   Hgb urine dipstick NEGATIVE NEGATIVE   Bilirubin Urine NEGATIVE NEGATIVE   Ketones, ur NEGATIVE NEGATIVE mg/dL   Protein, ur NEGATIVE NEGATIVE mg/dL   Nitrite NEGATIVE NEGATIVE   Leukocytes,Ua NEGATIVE NEGATIVE   Squamous Epithelial / HPF 6-10 0 - 5 /HPF   WBC, UA 6-10 0 - 5 WBC/hpf   RBC / HPF 0-5 0 - 5 RBC/hpf   Bacteria, UA MANY (A) NONE SEEN  Wet prep, genital     Status: Abnormal   Collection Time: 04/22/23  9:30 AM   Specimen: Vaginal  Result Value Ref Range   Yeast Wet Prep HPF POC NONE SEEN NONE SEEN   Trich, Wet Prep NONE SEEN NONE SEEN   Clue Cells Wet Prep  HPF POC PRESENT (A) NONE SEEN   WBC, Wet Prep HPF POC <10 (A) <10   Sperm NONE SEEN    No results found.  ED Clinical Impression  1. Bacterial vaginosis      ED Assessment/Plan     Wet prep positive for BV.  Home with Flagyl  for a week, Tylenol /ibuprofen  together 3 times a day.  Advised patient to not drink alcohol while taking the Flagyl .  She has many bacteria in her urine, and some WBCs, but this is a contaminated specimen.  Negative leukocytes, esterase.  Will send this off for culture prior to initiating treatment for any UTI.  Follow-up with PCP as needed.  Discussed labs,  MDM, treatment plan, and plan for follow-up with patient and spouse.  Discussed sn/sx that should prompt return to the ED. they agree with plan.   Meds ordered this encounter  Medications   metroNIDAZOLE  (FLAGYL ) 500 MG tablet    Sig: Take 1 tablet (500 mg total) by mouth 2 (two) times daily for 7 days.    Dispense:  14 tablet    Refill:  0   ibuprofen  (ADVIL ) 600 MG tablet    Sig: Take 1 tablet (600 mg total) by mouth every 8 (eight) hours as needed.    Dispense:  30 tablet    Refill:  0      *This clinic note was created using Scientist, clinical (histocompatibility and immunogenetics). Therefore, there may be occasional mistakes despite careful proofreading.  ?    Van Knee, MD 04/23/23 1159

## 2023-04-23 LAB — URINE CULTURE

## 2023-05-03 ENCOUNTER — Ambulatory Visit
Admission: RE | Admit: 2023-05-03 | Discharge: 2023-05-03 | Disposition: A | Discharge status: Home / Self Care | Payer: Medicare HMO | Source: Ambulatory Visit | Attending: Family Medicine | Admitting: Family Medicine

## 2023-05-03 DIAGNOSIS — Z1231 Encounter for screening mammogram for malignant neoplasm of breast: Secondary | ICD-10-CM | POA: Diagnosis present

## 2023-06-01 ENCOUNTER — Encounter: Payer: Self-pay | Admitting: Physician Assistant

## 2023-06-01 ENCOUNTER — Ambulatory Visit (INDEPENDENT_AMBULATORY_CARE_PROVIDER_SITE_OTHER): Payer: Medicare HMO

## 2023-06-01 ENCOUNTER — Ambulatory Visit
Admission: RE | Admit: 2023-06-01 | Discharge: 2023-06-01 | Disposition: A | Discharge status: Home / Self Care | Payer: Medicare HMO | Source: Ambulatory Visit | Attending: Physician Assistant | Admitting: Physician Assistant

## 2023-06-01 VITALS — BP 134/84 | HR 81 | Temp 97.7°F | Resp 14 | Ht 64.0 in | Wt 186.9 lb

## 2023-06-01 DIAGNOSIS — K219 Gastro-esophageal reflux disease without esophagitis: Secondary | ICD-10-CM | POA: Diagnosis present

## 2023-06-01 DIAGNOSIS — N898 Other specified noninflammatory disorders of vagina: Secondary | ICD-10-CM

## 2023-06-01 DIAGNOSIS — R1012 Left upper quadrant pain: Secondary | ICD-10-CM | POA: Diagnosis present

## 2023-06-01 DIAGNOSIS — R0781 Pleurodynia: Secondary | ICD-10-CM | POA: Diagnosis present

## 2023-06-01 DIAGNOSIS — R300 Dysuria: Secondary | ICD-10-CM

## 2023-06-01 DIAGNOSIS — M546 Pain in thoracic spine: Secondary | ICD-10-CM

## 2023-06-01 DIAGNOSIS — R0789 Other chest pain: Secondary | ICD-10-CM

## 2023-06-01 LAB — URINALYSIS, W/ REFLEX TO CULTURE (INFECTION SUSPECTED)
Bacteria, UA: NONE SEEN
Bilirubin Urine: NEGATIVE
Glucose, UA: NEGATIVE mg/dL
Hgb urine dipstick: NEGATIVE
Ketones, ur: NEGATIVE mg/dL
Leukocytes,Ua: NEGATIVE
Nitrite: NEGATIVE
Protein, ur: NEGATIVE mg/dL
Specific Gravity, Urine: 1.01 (ref 1.005–1.030)
WBC, UA: NONE SEEN WBC/hpf (ref 0–5)
pH: 7 (ref 5.0–8.0)

## 2023-06-01 MED ORDER — KETOROLAC TROMETHAMINE 60 MG/2ML IM SOLN
30.0000 mg | Freq: Once | INTRAMUSCULAR | Status: AC
  Administered 2023-06-01: 30 mg via INTRAMUSCULAR

## 2023-06-01 MED ORDER — ALUM & MAG HYDROXIDE-SIMETH 200-200-20 MG/5ML PO SUSP
30.0000 mL | Freq: Once | ORAL | Status: AC
  Administered 2023-06-01: 30 mL via ORAL

## 2023-06-01 MED ORDER — CELECOXIB 100 MG PO CAPS: ORAL_CAPSULE | ORAL | 1 refills | Status: AC

## 2023-06-01 MED ORDER — LIDOCAINE VISCOUS HCL 2 % MT SOLN
15.0000 mL | Freq: Once | OROMUCOSAL | Status: AC
  Administered 2023-06-01: 15 mL via ORAL

## 2023-06-01 NOTE — ED Triage Notes (Signed)
Patient c/o left upper abdominal pain that radiates to her back that started yesterday.  Patient reports difficulty urinating.  Patient was treated for BV at her last visit here on 1./5/25.  Patient denies any N/V/D.

## 2023-06-01 NOTE — Discharge Instructions (Addendum)
-  Urine was negative.  You do not have a UTI. - The vaginal swab results are pending.  Someone will contact you if you need medicine once the result returns. -Symptoms are likely due to musculoskeletal condition.  I sent an anti-inflammatory medication to the pharmacy but try to use the muscle relaxer and Tylenol first.  If you have any continued discomfort take the anti-inflammatory medication with food and make sure to continue to take your reflux medicine.  Increase rest and fluids. - If symptoms acutely worsen please go to the ER. - I will contact you with the results of your x-ray once they return today.

## 2023-06-01 NOTE — ED Provider Notes (Signed)
[facility]    CSN: [id number] Arrival date & time: 06/01/23  0810      History   Chief Complaint Chief Complaint  Patient presents with   Abdominal Pain    Appointment    HPI Veronica Kennedy is a 68 y.o. female presenting for 1 week history of left upper quadrant abdominal, left rib and left thoracic back pain. Denis injury. Pain has been sharp and constant.  Pain is currently 5 out of 10.  Nothing makes pain better or worse and it has not worsened from onset.  Symptoms have not worsened from onset. She also reports slight burning with urination yesterday but not today.   Patient reports fatigue but states that she just found out her husband has terminal cancer and should pass away in the next 4 months.  She states this news has been difficult to take and she has been more active than normal.  Denies fever, nausea, vomiting, diarrhea, constipation, cough, chest pain, wheezing, appetite changes.  Patient has history of GERD, hiatal hernia, asthma, hypertension, hyperlipidemia, and COPD. Denies feeling more SOB than normal.  States she takes medicine for acid reflux and has not missed any doses.  Of note, patient was seen here last month and diagnosed with BV.  She reports continued mild vaginal odor.  HPI  Past Medical History:  Diagnosis Date   Arthritis    Asthma    COPD (chronic obstructive pulmonary disease) (HCC)    Dyspnea    GERD (gastroesophageal reflux disease)    Hypercholesterolemia    Hypertension    Melanoma (HCC)    approximately 8 years ago resected from Right lower leg.    Multiple thyroid nodules    Seizure (HCC) 2013   x 1-unsure ot the cause   Wears dentures    partial upper and lower    Patient Active Problem List   Diagnosis Date Noted   Special screening for malignant neoplasms, colon 04/20/2022   Polyp of colon 04/20/2022   Epigastric pain 04/20/2022   Lumbar radiculopathy    GERD (gastroesophageal reflux disease) 09/21/2012    Stress 09/21/2012   Essential hypertension, benign 09/19/2012   Hypercholesterolemia 09/19/2012    Past Surgical History:  Procedure Laterality Date   BACK SURGERY     CATARACT EXTRACTION Bilateral    CHOLECYSTECTOMY     COLONOSCOPY     COLONOSCOPY WITH PROPOFOL N/A 04/20/2022   Procedure: COLONOSCOPY WITH BIOPSY;  Surgeon: Midge Minium, MD;  Location: [facility];  Service: Endoscopy;  Laterality: N/A;   ESOPHAGOGASTRODUODENOSCOPY (EGD) WITH PROPOFOL N/A 04/20/2022   Procedure: ESOPHAGOGASTRODUODENOSCOPY (EGD) WITH PROPOFOL;  Surgeon: Midge Minium, MD;  Location: [facility];  Service: Endoscopy;  Laterality: N/A;   LUMBAR LAMINECTOMY/DECOMPRESSION MICRODISCECTOMY Right 12/26/2021   Procedure: RIGHT L4-5 LAMINOFORAMINOTOMY;  Surgeon: Venetia Night, MD;  Location: [facility];  Service: Neurosurgery;  Laterality: Right;   POLYPECTOMY N/A 04/20/2022   Procedure: POLYPECTOMY;  Surgeon: Midge Minium, MD;  Location: [facility];  Service: Endoscopy;  Laterality: N/A;   TONSILLECTOMY     TUBAL LIGATION      OB History   No obstetric history on file.      Home Medications    Prior to Admission medications   Medication Sig Start Date End Date Taking? Authorizing Provider  celecoxib (CELEBREX) 100 MG capsule Take 1-2 tabs po bid prn pain 06/01/23  Yes Eusebio Friendly B, PA-C  albuterol (PROVENTIL HFA;VENTOLIN HFA) 108 (90 BASE) MCG/ACT inhaler Inhale  2 puffs into the lungs every 6 (six) hours as needed for wheezing.    [provider]  atorvastatin (LIPITOR) 10 MG tablet Take 1 tablet (10 mg total) by mouth daily. Patient taking differently: Take 10 mg by mouth every morning. 11/08/12   Dale [place], MD  Calcium Carb-Cholecalciferol (CALCIUM 500 + D PO) Take 1 tablet by mouth daily at 6 (six) AM.    [provider]  Cholecalciferol (D 1000) 25 MCG (1000 UT) capsule Take by mouth.    [provider]  citalopram (CELEXA) 40 MG tablet  Take 1.5 tablets (60 mg total) by mouth daily. Patient taking differently: Take 40 mg by mouth every morning. 09/21/12   Dale [place]ingo, MD  cyanocobalamin (VITAMIN B12) 1000 MCG tablet Take by mouth.    [provider]  cyclobenzaprine (FLEXERIL) 10 MG tablet Take 1 tablet (10 mg total) by mouth 3 (three) times daily as needed for muscle spasms. Do not drive while taking as can cause drowsiness 02/11/21   Shirlee Latch, PA-C  Fluticasone-Salmeterol (ADVAIR) 250-50 MCG/DOSE AEPB Inhale 1 puff into the lungs every morning. 05/24/18   [provider]  hyoscyamine (LEVSIN) 0.125 MG tablet Take 0.125 mg by mouth every 4 (four) hours as needed for cramping.    [provider]  ibuprofen (ADVIL) 600 MG tablet Take 1 tablet (600 mg total) by mouth every 8 (eight) hours as needed. 04/22/23   Domenick Gong, MD  pantoprazole (PROTONIX) 40 MG tablet 40 mg 2 (two) times daily. 01/07/18   [provider]  ramipril (ALTACE) 5 MG capsule Take 5 mg by mouth every morning. 10/10/19   [provider]  SUMAtriptan (IMITREX) 25 MG tablet May repeat in 2 hours if headache persists or recurs. Patient taking differently: Take 25 mg by mouth every 2 (two) hours as needed. May repeat in 2 hours if headache persists or recurs. 02/11/21   Shirlee Latch, PA-C  traZODone (DESYREL) 150 MG tablet TAKE 1 TABLET (150 MG TOTAL) AT BEDTIME Patient taking differently: Take 200 mg by mouth at bedtime. 01/07/13   Dale [place], MD  umeclidinium bromide (INCRUSE ELLIPTA) 62.5 MCG/ACT AEPB Inhale 1 puff into the lungs every morning.    [provider]  esomeprazole (NEXIUM) 40 MG capsule Take 1 capsule (40 mg total) by mouth daily. 12/02/12 10/28/18  Dale [place], MD  fluticasone (FLONASE) 50 MCG/ACT nasal spray Place 2 sprays into both nostrils daily. 07/15/15 10/28/18  Hassan Rowan, MD  hydrochlorothiazide (HYDRODIURIL) 25 MG tablet Take 1 tablet (25 mg total) by mouth daily. 09/30/12  04/12/20  Dale , MD    Family History Family History  Problem Relation Age of Onset   Breast cancer Maternal Aunt 29   Dementia Mother    Heart disease Mother    Diabetes Mother    Heart attack Father    AAA (abdominal aortic aneurysm) Father    Hypertension Father    Hyperlipidemia Father     Social History Social History   Tobacco Use   Smoking status: Former    Current packs/day: 0.00    Types: Cigarettes    Quit date: 06/08/2005    Years since quitting: 17.9   Smokeless tobacco: Never  Vaping Use   Vaping status: Never Used  Substance Use Topics   Alcohol use: Yes    Comment: occasional   Drug use: No     Allergies   Other   Review of Systems Review of Systems  Constitutional:  Negative for appetite change, fatigue and fever.  HENT:  Negative for congestion.   Respiratory:  Negative for cough, shortness of breath and wheezing.   Cardiovascular:  Negative for chest pain.  Gastrointestinal:  Positive for abdominal pain. Negative for constipation, diarrhea, nausea and vomiting.  Musculoskeletal:  Positive for arthralgias (left rib) and back pain.  Neurological:  Negative for dizziness, weakness, numbness and headaches.     Physical Exam Triage Vital Signs ED Triage Vitals  Encounter Vitals Group     BP 06/01/23 0827 134/84     Systolic BP Percentile --      Diastolic BP Percentile --      Pulse Rate 06/01/23 0827 81     Resp 06/01/23 0827 14     Temp 06/01/23 0827 97.7 F (36.5 C)     Temp Source 06/01/23 0827 Oral     SpO2 06/01/23 0827 95 %     Weight 06/01/23 0824 186 lb 15.2 oz (84.8 kg)     Height 06/01/23 0824 5\' 4"  (1.626 m)     Head Circumference --      Peak Flow --      Pain Score 06/01/23 0824 5     Pain Loc --      Pain Education --      Exclude from Growth Chart --    No data found.  Updated Vital Signs BP 134/84 (BP Location: Right Arm)   Pulse 81   Temp 97.7 F (36.5 C) (Oral)   Resp 14   Ht 5\' 4"  (1.626 m)    Wt 186 lb 15.2 oz (84.8 kg)   SpO2 95%   BMI 32.09 kg/m     Physical Exam Vitals and nursing note reviewed.  Constitutional:      General: She is not in acute distress.    Appearance: Normal appearance. She is not ill-appearing or toxic-appearing.  HENT:     Head: Normocephalic and atraumatic.     Nose: Nose normal.     Mouth/Throat:     Mouth: Mucous membranes are moist.     Pharynx: Oropharynx is clear.  Eyes:     General: No scleral icterus.       Right eye: No discharge.        Left eye: No discharge.     Conjunctiva/sclera: Conjunctivae normal.  Cardiovascular:     Rate and Rhythm: Normal rate and regular rhythm.     Heart sounds: Normal heart sounds.  Pulmonary:     Effort: Pulmonary effort is normal. No respiratory distress.     Breath sounds: Normal breath sounds.  Abdominal:     Palpations: Abdomen is soft.     Tenderness: There is no abdominal tenderness. There is no right CVA tenderness, left CVA tenderness, guarding or rebound.  Musculoskeletal:     Cervical back: Neck supple.     Comments: BACK/RIBS: There is tenderness of the left anterior and lateral ribs as well as left parathoracic back muscles.  Skin:    General: Skin is dry.  Neurological:     General: No focal deficit present.     Mental Status: She is alert. Mental status is at baseline.     Motor: No weakness.     Gait: Gait normal.  Psychiatric:        Mood and Affect: Mood normal.        Behavior: Behavior normal.      UC Treatments / Results  Labs (all labs ordered are  listed, but only abnormal results are displayed) Labs Reviewed  URINALYSIS, W/ REFLEX TO CULTURE (INFECTION SUSPECTED)  CERVICOVAGINAL ANCILLARY ONLY    EKG   Radiology DG Ribs Unilateral W/Chest Left Result Date: 06/01/2023 CLINICAL DATA:  Atraumatic left rib pain. Left upper abdominal pain. EXAM: LEFT RIBS AND CHEST - 3+ VIEW COMPARISON:  Chest radiograph 04/29/2022 FINDINGS: Lungs are clear without a pneumothorax.  Heart size is normal. Marker was placed in left lower chest. Left ribs are intact without a displaced fracture. IMPRESSION: Negative left ribs and chest radiographs. Electronically Signed   By: Richarda Overlie M.D.   On: 06/01/2023 10:18    Procedures ED EKG  Date/Time: 06/01/2023 9:00 AM  Performed by: Shirlee Latch, PA-C Authorized by: Shirlee Latch, PA-C   Previous ECG:    Previous ECG:  Unavailable Interpretation:    Interpretation: normal   Rate:    ECG rate:  69 Rhythm:    Rhythm: sinus rhythm   Ectopy:    Ectopy: none   QRS:    QRS axis:  Normal   QRS intervals:  Normal   QRS conduction: normal   ST segments:    ST segments:  Normal T waves:    T waves: normal   Comments:     Normal sinus rhythm. Regular rate.  (including critical care time)  Medications Ordered in UC Medications  alum & mag hydroxide-simeth (MAALOX/MYLANTA) 200-200-20 MG/5ML suspension 30 mL (30 mLs Oral Given 06/01/23 0856)    And  lidocaine (XYLOCAINE) 2 % viscous mouth solution 15 mL (15 mLs Oral Given 06/01/23 0856)  ketorolac (TORADOL) injection 30 mg (30 mg Intramuscular Given 06/01/23 0940)    Initial Impression / Assessment and Plan / UC Course  I have reviewed the triage vital signs and the nursing notes.  Pertinent labs & imaging results that were available during my care of the patient were reviewed by me and considered in my medical decision making (see chart for details).   68 year old female with history of hypertension, hyperlipidemia, asthma, COPD, GERD and hiatal hernia presents for 1 week history of left upper quadrant, left rib and left thoracic back pain.  Nothing makes symptoms better or worse.  Also reports slight burning with urination yesterday and vaginal odor.  Patient with possible condition which could represent threat to life or bodily harm.  DDx: Left rib injury, musculoskeletal pain/strain/spasm, gastritis/gastroenteritis, rib fracture, pneumonia, pneumothorax, GERD,  hiatal hernia pain, ACS, PE, shingles, UTI, pyelonephritis, constipation, SBO  Vitals are stable and normal.  She is overall well-appearing.  No acute distress.  Normal HEENT exam.  Abdomen soft and nontender.  She has tenderness of the left anterior/lateral ribs and left parathoracic muscles.  Chest clear.  Heart regular rate and rhythm.  Urinalysis and vaginal swab obtained by nursing staff.  Ordered chest x-ray and EKG given location of pain.  Also, patient was given GI cocktail.  EKG shows normal sinus rhythm and regular rate of 69 bpm.  UA is normal.  Reviewed results with patient.   Rib/CXR wet read negative.   Patient reports that GI cocktail was not helpful.   Suspect symptoms are due to musculoskeletal condition. Advised Tylenol, NSAIDs, and muscle relaxers as well as stretching and heat. Patient reports having muscle relaxers at home. She was given 30 mg IM ketorolac for pain relief. Advised I will contact her if radiologist finds abnormality on imaging. Otherwise, care as discussed. Reviewed return and ED precautions. Advised to follow up with PCP  next week especially is no improvement in symptoms. Patient agreeable to plan.  Imaging overead is negative.    Final Clinical Impressions(s) / UC Diagnoses   Final diagnoses:  Abdominal pain, left upper quadrant  Rib pain on left side  Acute left-sided thoracic back pain  Gastroesophageal reflux disease, unspecified whether esophagitis present  Vaginal odor  Dysuria     Discharge Instructions      -Urine was negative.  You do not have a UTI. - The vaginal swab results are pending.  Someone will contact you if you need medicine once the result returns. -Symptoms are likely due to musculoskeletal condition.  I sent an anti-inflammatory medication to the pharmacy but try to use the muscle relaxer and Tylenol first.  If you have any continued discomfort take the anti-inflammatory medication with food and make sure to continue  to take your reflux medicine.  Increase rest and fluids. - If symptoms acutely worsen please go to the ER. - I will contact you with the results of your x-ray once they return today.     ED Prescriptions     Medication Sig Dispense Auth. Provider   celecoxib (CELEBREX) 100 MG capsule Take 1-2 tabs po bid prn pain 30 capsule Shirlee Latch, PA-C      PDMP not reviewed this encounter.   Shirlee Latch, PA-C 06/01/23 1034

## 2023-06-04 LAB — CERVICOVAGINAL ANCILLARY ONLY
Bacterial Vaginitis (gardnerella): NEGATIVE
Candida Glabrata: NEGATIVE
Candida Vaginitis: NEGATIVE
Comment: NEGATIVE
Comment: NEGATIVE
Comment: NEGATIVE

## 2023-07-26 IMAGING — MG MM DIGITAL SCREENING BILAT W/ TOMO AND CAD
8 series · 8 of 24 positions shown · non-contrast
Comparison: Previous exam(s).

CLINICAL DATA: Screening.

EXAM:
DIGITAL SCREENING BILATERAL MAMMOGRAM WITH TOMOSYNTHESIS AND CAD
TECHNIQUE: Bilateral screening digital craniocaudal and mediolateral oblique
mammograms were obtained. Bilateral screening digital breast
tomosynthesis was performed. The images were evaluated with
computer-aided detection.

[L CC synth-2D]
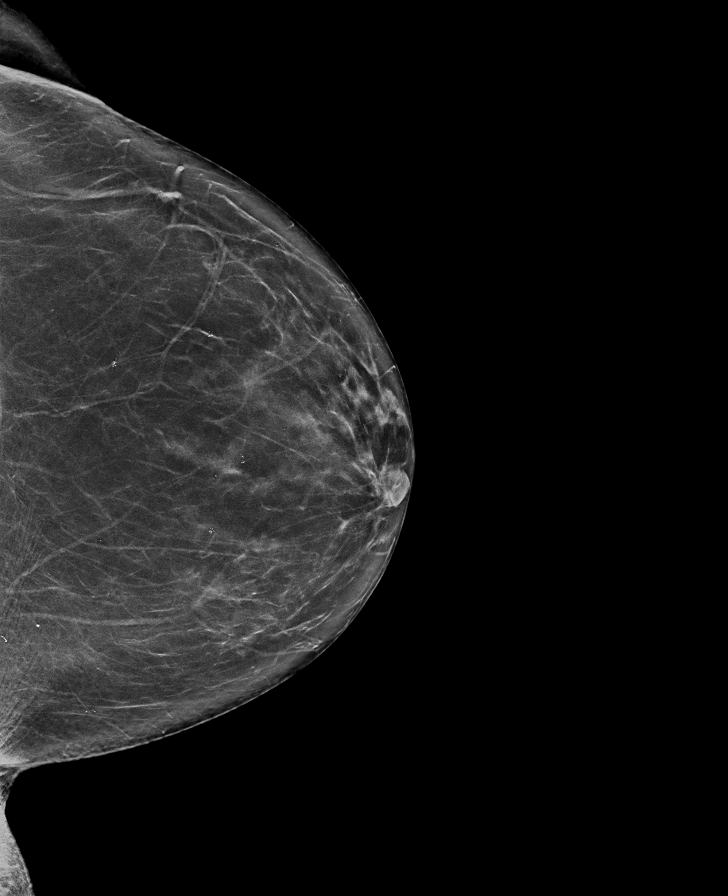

[R MLO synth-2D]
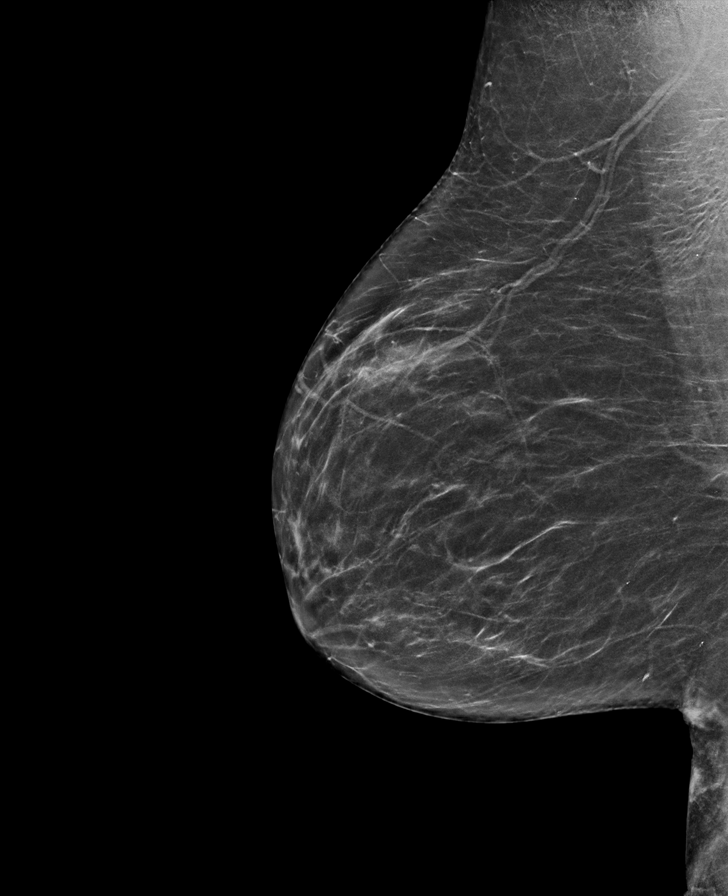

[R CC synth-2D]
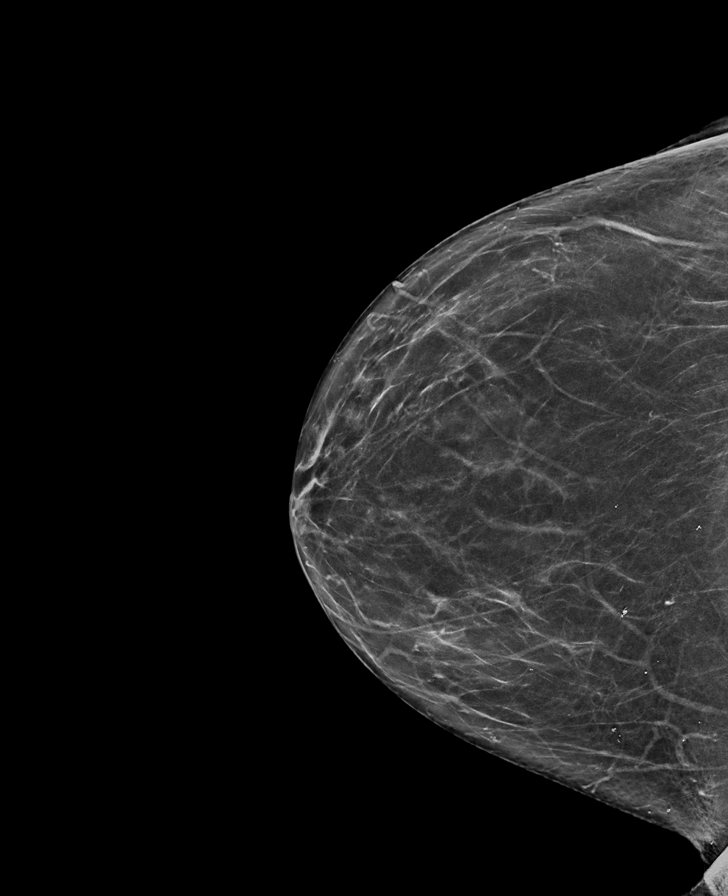

[L MLO synth-2D]
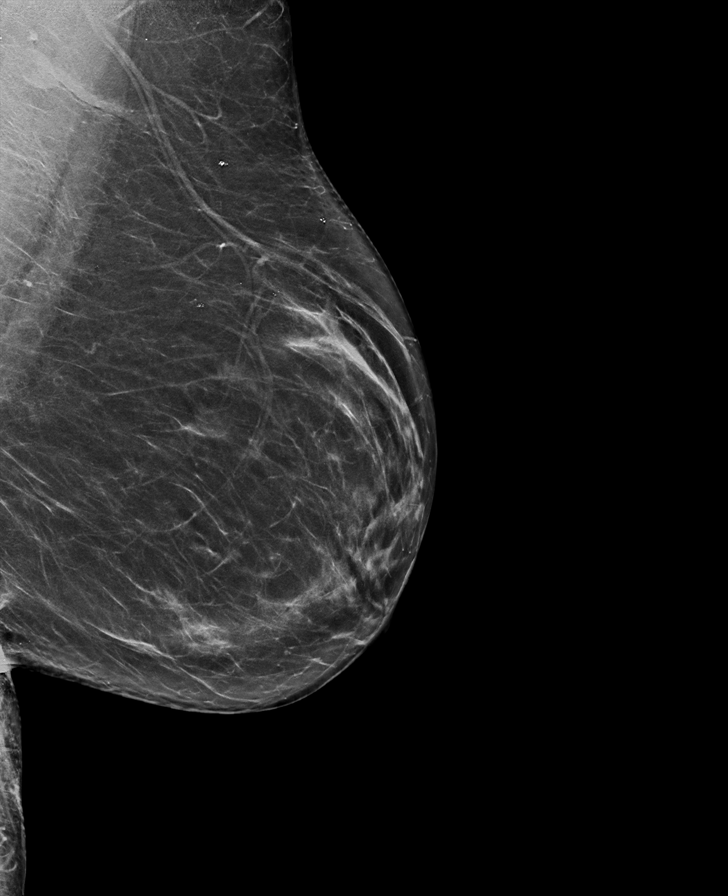

[R MLO tomo · tomo slice 36/71.0]
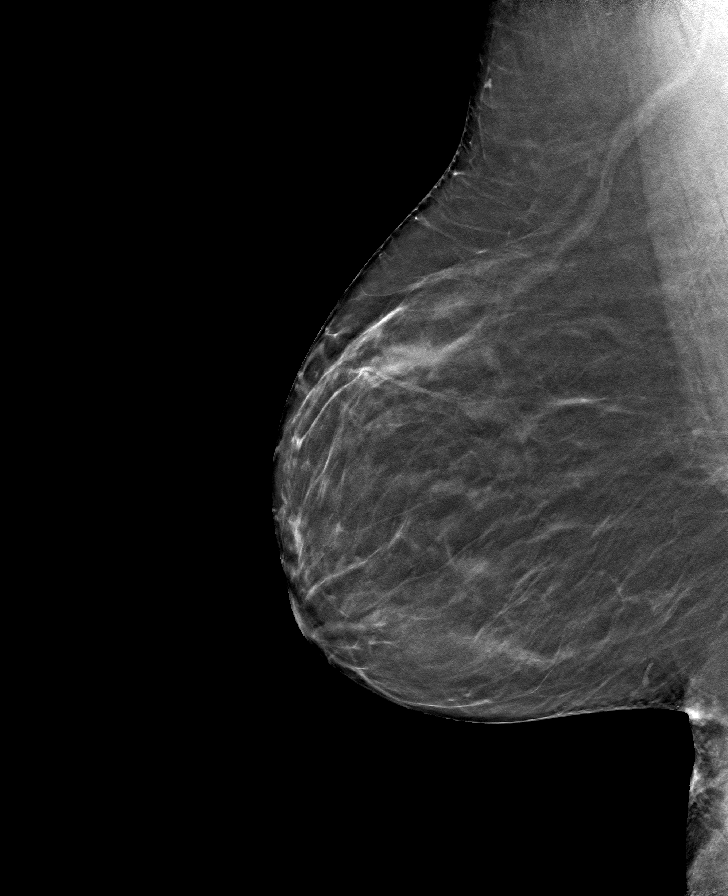

[R CC tomo · tomo slice 35/68.0]
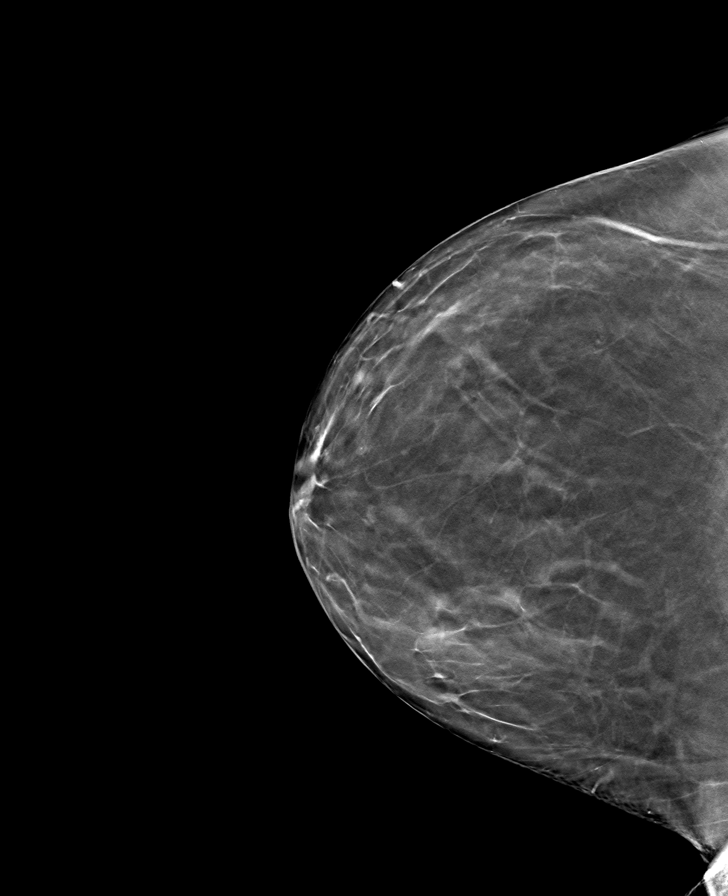

[L CC tomo · tomo slice 39/76.0]
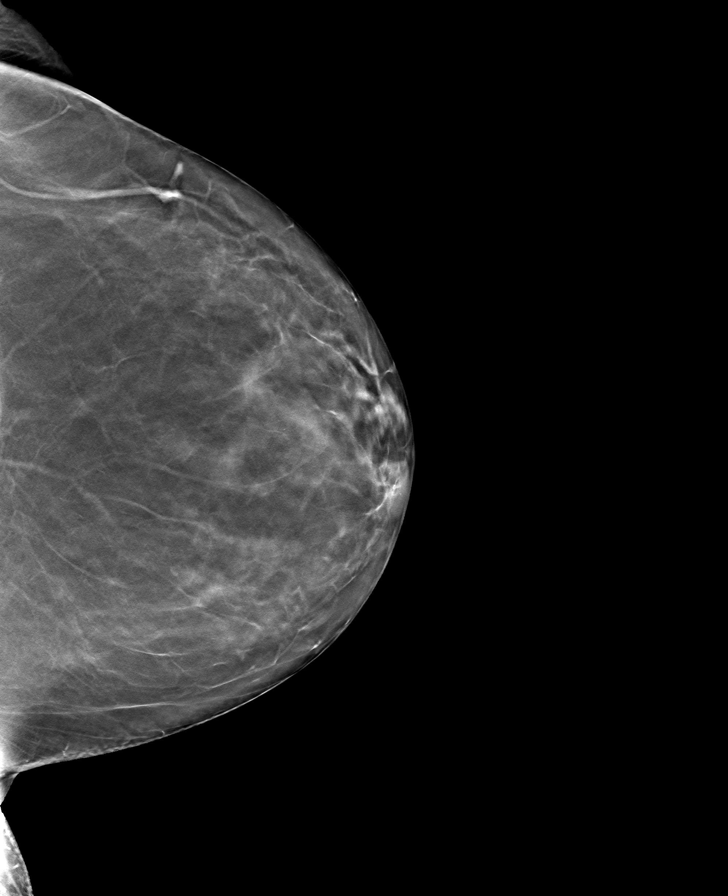

[L MLO tomo · tomo slice 38/75.0]
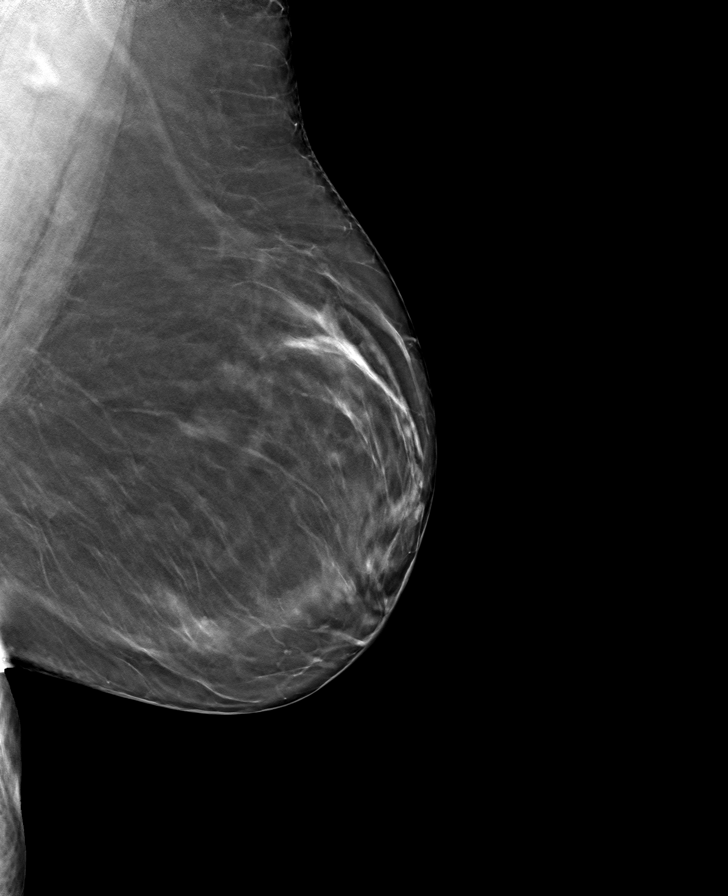

[8 of 24 positions shown; findings below may reference images not displayed]

ACR Breast Density Category b: There are scattered areas of
fibroglandular density.
FINDINGS: There are no findings suspicious for malignancy.
IMPRESSION: No mammographic evidence of malignancy. A result letter of this
screening mammogram will be mailed directly to the patient.

RECOMMENDATION:
Screening mammogram in one year. (Code:51-O-LD2)

BI-RADS CATEGORY  1: Negative.

## 2023-11-16 ENCOUNTER — Emergency Department

## 2023-11-16 ENCOUNTER — Other Ambulatory Visit: Payer: Self-pay

## 2023-11-16 ENCOUNTER — Emergency Department
Admission: EM | Admit: 2023-11-16 | Discharge: 2023-11-16 | Disposition: A | Discharge status: Home / Self Care | Attending: Emergency Medicine | Admitting: Emergency Medicine

## 2023-11-16 DIAGNOSIS — I10 Essential (primary) hypertension: Secondary | ICD-10-CM | POA: Insufficient documentation

## 2023-11-16 DIAGNOSIS — K2940 Chronic atrophic gastritis without bleeding: Secondary | ICD-10-CM | POA: Diagnosis not present

## 2023-11-16 DIAGNOSIS — R1084 Generalized abdominal pain: Secondary | ICD-10-CM

## 2023-11-16 LAB — CBC WITH DIFFERENTIAL/PLATELET
Abs Immature Granulocytes: 0.03 K/uL (ref 0.00–0.07)
Basophils Absolute: 0 K/uL (ref 0.0–0.1)
Basophils Relative: 0 %
Eosinophils Absolute: 0 K/uL (ref 0.0–0.5)
Eosinophils Relative: 0 %
HCT: 36.6 % (ref 36.0–46.0)
Hemoglobin: 12.3 g/dL (ref 12.0–15.0)
Immature Granulocytes: 0 %
Lymphocytes Relative: 2 %
Lymphs Abs: 0.2 K/uL — ABNORMAL LOW (ref 0.7–4.0)
MCH: 32 pg (ref 26.0–34.0)
MCHC: 33.6 g/dL (ref 30.0–36.0)
MCV: 95.3 fL (ref 80.0–100.0)
Monocytes Absolute: 0 K/uL — ABNORMAL LOW (ref 0.1–1.0)
Monocytes Relative: 0 %
Neutro Abs: 8.8 K/uL — ABNORMAL HIGH (ref 1.7–7.7)
Neutrophils Relative %: 98 %
Platelets: 217 K/uL (ref 150–400)
RBC: 3.84 MIL/uL — ABNORMAL LOW (ref 3.87–5.11)
RDW: 12.5 % (ref 11.5–15.5)
WBC: 9.1 K/uL (ref 4.0–10.5)
nRBC: 0 % (ref 0.0–0.2)

## 2023-11-16 LAB — COMPREHENSIVE METABOLIC PANEL WITH GFR
ALT: 26 U/L (ref 0–44)
AST: 33 U/L (ref 15–41)
Albumin: 3.5 g/dL (ref 3.5–5.0)
Alkaline Phosphatase: 63 U/L (ref 38–126)
Anion gap: 12 (ref 5–15)
BUN: 20 mg/dL (ref 8–23)
CO2: 24 mmol/L (ref 22–32)
Calcium: 8.5 mg/dL — ABNORMAL LOW (ref 8.9–10.3)
Chloride: 104 mmol/L (ref 98–111)
Creatinine, Ser: 0.88 mg/dL (ref 0.44–1.00)
GFR, Estimated: >60 mL/min (ref >60)
Glucose, Bld: 84 mg/dL (ref 70–99)
Potassium: 3.7 mmol/L (ref 3.5–5.1)
Sodium: 140 mmol/L (ref 135–145)
Total Bilirubin: 0.8 mg/dL (ref 0.0–1.2)
Total Protein: 5.9 g/dL — ABNORMAL LOW (ref 6.5–8.1)

## 2023-11-16 LAB — URINALYSIS, W/ REFLEX TO CULTURE (INFECTION SUSPECTED)
Bacteria, UA: NONE SEEN
Bilirubin Urine: NEGATIVE
Glucose, UA: NEGATIVE mg/dL
Hgb urine dipstick: NEGATIVE
Ketones, ur: NEGATIVE mg/dL
Leukocytes,Ua: NEGATIVE
Nitrite: NEGATIVE
Protein, ur: NEGATIVE mg/dL
Specific Gravity, Urine: 1.045 — ABNORMAL HIGH (ref 1.005–1.030)
pH: 7 (ref 5.0–8.0)

## 2023-11-16 LAB — LIPASE, BLOOD: Lipase: 28 U/L (ref 11–51)

## 2023-11-16 LAB — LACTIC ACID, PLASMA: Lactic Acid, Venous: 1.7 mmol/L (ref 0.5–1.9)

## 2023-11-16 LAB — TROPONIN I (HIGH SENSITIVITY)
Troponin I (High Sensitivity): 5 ng/L (ref <18)
Troponin I (High Sensitivity): 5 ng/L (ref <18)

## 2023-11-16 MED ORDER — SODIUM CHLORIDE 0.9 % IV BOLUS
1000.0000 mL | Freq: Once | INTRAVENOUS | Status: AC
  Administered 2023-11-16: 1000 mL via INTRAVENOUS

## 2023-11-16 MED ORDER — SUCRALFATE 1 G PO TABS: 1.0000 g | ORAL_TABLET | Freq: Three times a day (TID) | ORAL | 0 refills | Status: AC

## 2023-11-16 MED ORDER — ONDANSETRON HCL 4 MG/2ML IJ SOLN
4.0000 mg | Freq: Once | INTRAMUSCULAR | Status: AC
  Administered 2023-11-16: 4 mg via INTRAVENOUS
  Filled 2023-11-16: qty 2

## 2023-11-16 MED ORDER — MORPHINE SULFATE (PF) 4 MG/ML IV SOLN
4.0000 mg | Freq: Once | INTRAVENOUS | Status: DC
  Filled 2023-11-16: qty 1

## 2023-11-16 MED ORDER — IOHEXOL 300 MG/ML  SOLN
100.0000 mL | Freq: Once | INTRAMUSCULAR | Status: AC | PRN
  Administered 2023-11-16: 100 mL via INTRAVENOUS

## 2023-11-16 MED ORDER — ONDANSETRON 4 MG PO TBDP: 4.0000 mg | ORAL_TABLET | Freq: Three times a day (TID) | ORAL | 0 refills | Status: AC | PRN

## 2023-11-16 MED ORDER — PANTOPRAZOLE SODIUM 40 MG IV SOLR
40.0000 mg | Freq: Once | INTRAVENOUS | Status: AC
  Administered 2023-11-16: 40 mg via INTRAVENOUS
  Filled 2023-11-16: qty 10

## 2023-11-16 MED ORDER — MORPHINE SULFATE (PF) 4 MG/ML IV SOLN
4.0000 mg | Freq: Once | INTRAVENOUS | Status: AC
  Administered 2023-11-16: 4 mg via INTRAVENOUS
  Filled 2023-11-16: qty 1

## 2023-11-16 MED ORDER — IOHEXOL 350 MG/ML SOLN
100.0000 mL | Freq: Once | INTRAVENOUS | Status: AC | PRN
  Administered 2023-11-16: 100 mL via INTRAVENOUS

## 2023-11-16 MED ORDER — FENTANYL CITRATE PF 50 MCG/ML IJ SOSY
50.0000 ug | PREFILLED_SYRINGE | Freq: Once | INTRAMUSCULAR | Status: AC
  Administered 2023-11-16: 50 ug via INTRAVENOUS
  Filled 2023-11-16: qty 1

## 2023-11-16 NOTE — ED Notes (Signed)
 Patient transported to CT

## 2023-11-16 NOTE — ED Notes (Signed)
Patient ambulatory to bathroom with one-person assist.

## 2023-11-16 NOTE — Discharge Instructions (Addendum)
 You can take Tylenol  1 g every 8 hours to help with pain. Return to the ER for worsening symptoms or any other concerns  Please call GI to make a follow-up appointment to discuss your abnormal esophagus.  Scattered atherosclerotic changes identified. No dissection or  aneurysm formation identified. Note the precontrast dataset through  the chest is slightly limited due to residual contrast enhancement  from the prior contrast administration earlier on 11/16/2023 for the  abdomen and pelvis CT.    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 Please correlate with  symptoms.    Adrenal adenoma as previously described.    Previous cholecystectomy with prominence of the biliary tree.    3 mm right-sided lung nodule. If patient is low risk for malignancy,  no routine follow-up imaging is recommended. If patient is high risk  for malignancy, a non-contrast chest CT at 12 months is  optional.This recommendation follows the consensus statement:  Guidelines for Management of Incidental Pulmonary Nodules Detected  on CT Images: From the Fleischner Society 2017; Radiology 2017;  [phone].

## 2023-11-16 NOTE — ED Provider Notes (Signed)
 [facility] Provider Note    Event Date/Time   First MD Initiated Contact with Patient 11/16/23 1051     (approximate)   History   Abdominal Pain   HPI  Veronica Kennedy is a 68 y.o. female past medical history significant for hiatal hernia, hypertension, who presents to the emergency department with abdominal pain.  States that this morning she woke up with diffuse abdominal pain that was worse in her upper abdomen.  Associated with some nausea.  Stated that she was also having some back pain.  When EMS arrived patient had a soft blood pressure and was given 300 bolus of fluids with improvement of her blood pressure.  Currently rates her pain as 5/10 and describes as a cramping pain and feels like there is knots in her stomach.  Endorses mild nausea.  Denies any chest pain or shortness of breath.  Denies fever but does endorse chills earlier today.  Denies dysuria, urinary urgency or frequency.  Does have a history of prior urinary tract infections in the past.  Denies any history of kidney stone.  Prior cholecystectomy.  Denies any recent falls or head trauma.  Denies headache.  Denies alcohol use or drug use.     Physical Exam   Triage Vital Signs: ED Triage Vitals  Encounter Vitals Group     BP      Girls Systolic BP Percentile      Girls Diastolic BP Percentile      Boys Systolic BP Percentile      Boys Diastolic BP Percentile      Pulse      Resp      Temp      Temp src      SpO2      Weight      Height      Head Circumference      Peak Flow      Pain Score      Pain Loc      Pain Education      Exclude from Growth Chart     Most recent vital signs: Vitals:   11/16/23 1554 11/16/23 1555  BP:    Pulse: 82 81  Resp: 18 12  Temp:    SpO2: 93% 91%    Physical Exam Constitutional:      Appearance: She is well-developed.  HENT:     Head: Atraumatic.  Eyes:     Conjunctiva/sclera: Conjunctivae normal.  Cardiovascular:     Rate and  Rhythm: Regular rhythm.  Pulmonary:     Effort: No respiratory distress.  Abdominal:     General: Abdomen is flat. There is no distension.     Tenderness: There is generalized abdominal tenderness and tenderness in the epigastric area and periumbilical area.  Musculoskeletal:        General: Normal range of motion.     Cervical back: Normal range of motion.  Skin:    General: Skin is warm.     Capillary Refill: Capillary refill takes less than 2 seconds.  Neurological:     General: No focal deficit present.     Mental Status: She is alert. Mental status is at baseline.  Psychiatric:        Mood and Affect: Mood normal.     IMPRESSION / MDM / ASSESSMENT AND PLAN / ED COURSE  I reviewed the triage vital signs and the nursing notes.  Differential diagnosis including vasovagal episode, dehydration, electrolyte abnormality, SBO,  appendicitis, pancreatitis  Given 1 L of IV fluids and fentanyl  for pain control.  Denies nausea at this time  EKG  I, Clotilda Punter, the attending physician, personally viewed and interpreted this ECG.   Rate: Normal  Rhythm: Normal sinus  Axis: Normal  Intervals: Normal  ST&T Change: None  No tachycardic or bradycardic dysrhythmias while on cardiac telemetry.  RADIOLOGY I independently reviewed imaging, my interpretation of imaging: CT scan abdomen and pelvis with contrast -no findings of small bowel obstruction.  Read as no acute findings.  LABS (all labs ordered are listed, but only abnormal results are displayed) Labs interpreted as -    Labs Reviewed  CBC WITH DIFFERENTIAL/PLATELET - Abnormal; Notable for the following components:      Result Value   RBC 3.84 (*)    Neutro Abs 8.8 (*)    Lymphs Abs 0.2 (*)    Monocytes Absolute 0.0 (*)    All other components within normal limits  COMPREHENSIVE METABOLIC PANEL WITH GFR - Abnormal; Notable for the following components:   Calcium  8.5 (*)    Total Protein 5.9 (*)    All other components  within normal limits  LIPASE, BLOOD  LACTIC ACID, PLASMA  URINALYSIS, W/ REFLEX TO CULTURE (INFECTION SUSPECTED)  LACTIC ACID, PLASMA  TROPONIN I (HIGH SENSITIVITY)     MDM  No leukocytosis.  No anemia.  Creatinine at baseline with no significant electrolyte abnormality.  Lipase normal.  Troponin negative, have low suspicion for ACS.  CT scan abdomen and pelvis with no acute findings.  Still unable to urinate, requesting in and out for UA for possible urinary tract infection.  Clinical Course as of 11/16/23 1605  Fri Nov 16, 2023  1603 After morphine called into the room with severe abdominal pain that was worse, states that it is worse than whenever she arrived, now with significantly low blood pressure with a blood pressure of 70/40.  Likely in the setting of IV morphine.  Will give IV fluid bolus.  Given IV Protonix and antiemetics.  Patient had a CT scan abdomen and pelvis without acute findings.  Given severe pain and hypotension we will obtain a dissection study.  Care transferred to incoming provider with plan for follow-up on dissection study.  Lactic acid obtained and was normal at 1.7. [SM]    Clinical Course User Index [SM] Punter Clotilda, MD     PROCEDURES:  Critical Care performed: yes  .Critical Care  Performed by: Punter Clotilda, MD Authorized by: Punter Clotilda, MD   Critical care provider statement:    Critical care time (minutes):  30   Critical care time was exclusive of:  Separately billable procedures and treating other patients   Critical care was necessary to treat or prevent imminent or life-threatening deterioration of the following conditions:  Circulatory failure   Critical care was time spent personally by me on the following activities:  Development of treatment plan with patient or surrogate, discussions with consultants, evaluation of patient's response to treatment, examination of patient, ordering and review of laboratory studies, ordering and  review of radiographic studies, ordering and performing treatments and interventions, pulse oximetry, re-evaluation of patient's condition and review of old charts   Patient's presentation is most consistent with acute presentation with potential threat to life or bodily function.   MEDICATIONS ORDERED IN ED: Medications  morphine (PF) 4 MG/ML injection 4 mg (4 mg Intravenous Not Given 11/16/23 1508)  sodium chloride  0.9 % bolus 1,000 mL (0 mLs  Intravenous Stopped 11/16/23 1405)  fentaNYL  (SUBLIMAZE ) injection 50 mcg (50 mcg Intravenous Given 11/16/23 1108)  iohexol (OMNIPAQUE) 300 MG/ML solution 100 mL (100 mLs Intravenous Contrast Given 11/16/23 1229)  morphine (PF) 4 MG/ML injection 4 mg (4 mg Intravenous Given 11/16/23 1404)  ondansetron  (ZOFRAN ) injection 4 mg (4 mg Intravenous Given 11/16/23 1452)  pantoprazole (PROTONIX) injection 40 mg (40 mg Intravenous Given 11/16/23 1507)  sodium chloride  0.9 % bolus 1,000 mL (1,000 mLs Intravenous New Bag/Given 11/16/23 1507)    FINAL CLINICAL IMPRESSION(S) / ED DIAGNOSES   Final diagnoses:  None     Rx / DC Orders   ED Discharge Orders     None        Note:  This document was prepared using Dragon voice recognition software and may include unintentional dictation errors.   Suzanne Kirsch, MD 11/16/23 [phone]

## 2023-11-16 NOTE — ED Provider Notes (Signed)
 4:52 PM Assumed care for off going team.   Blood pressure (!) 84/57, pulse 81, temperature 98.7 F (37.1 C), temperature source Oral, resp. rate 12, height 5' 4 (1.626 m), weight 75.9 kg, SpO2 91%.  See their HPI for full report but in brief pending re-eval     4:55 PM went to reevaluate patient.  Blood pressures have come up.  Patient was actually in CT scan.  Discussed with family waiting on the CT results.  Lactate was normal.  Troponin was negative CBC reassuring CMP reassuring  Discussed with patient the CT scan of the patulous esophagus.  She denies any history of motility issues but I did recommend that she follows up with GI for further testing of this.  She is tolerating p.o. so I do not feel like she has an acute obstruction of this that require admission.  We discussed admission versus going home but given her blood pressures have come up after morphine has worn out she has near resolution of pain and that she is tolerating p.o. patient feels comfortable with discharge home  Incidental findings on CT scan were discussed with patient and provided copy of report  6:23 PM  Given she reports the pain was epigastric in nature she is already on PPI 40 mg twice daily will add on Carafate, Tylenol , Zofran  as needed.  She expressed understanding and felt comfortable with this plan.  Repeat abdominal exam is soft and nontender and patient feels comfortable with discharge plan    Scattered atherosclerotic changes identified. No dissection or  aneurysm formation identified. Note the precontrast dataset through  the chest is slightly limited due to residual contrast enhancement  from the prior contrast administration earlier on 11/16/2023 for the  abdomen and pelvis CT.    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 Please correlate with  symptoms.    Adrenal adenoma as previously described.    Previous cholecystectomy with prominence of the biliary tree.    3 mm right-sided lung  nodule. If patient is low risk for malignancy,  no routine follow-up imaging is recommended. If patient is high risk  for malignancy, a non-contrast chest CT at 12 months is  optional.This recommendation follows the consensus statement:  Guidelines for Management of Incidental Pulmonary Nodules Detected  on CT Images: From the Fleischner Society 2017; Radiology 2017;  [phone].   Prominence of biliary tree but her CMP and lipase are normal so doubt obstructive stone   Ernest Ronal BRAVO, MD 11/16/23 [phone]

## 2023-11-16 NOTE — ED Notes (Signed)
 Pt with no complaints of nausea, vomiting, or abdominal following PO challenge. MD Ernest notified.

## 2023-11-16 NOTE — ED Triage Notes (Signed)
 Pt comes in via [facility] with complaints of abdominal pain and back pain 5/10 that started about an hour ago this morning. Pt complains of nausea, no diarrhea and no vomiting. Pt has a history of abdominal hernia. Pt is alert and oriented x4, with no signs of acute distress at this time.

## 2023-11-16 NOTE — ED Notes (Signed)
 Pt given a sprite and saltine crackers.

## 2023-12-05 NOTE — Anesthesia Preprocedure Evaluation (Signed)
Anesthesia Evaluation  Patient identified by MRN, date of birth, ID band Patient awake    Reviewed: Allergy & Precautions, H&P , NPO status , Patient's Chart, lab work & pertinent test results, reviewed documented beta blocker date and time   Airway Mallampati: II   Neck ROM: full    Dental  (+) Poor Dentition   Pulmonary shortness of breath and with exertion, asthma , COPD,  COPD inhaler, neg recent URI, former smoker   Pulmonary exam normal        Cardiovascular Exercise Tolerance: Poor hypertension, On Medications negative cardio ROS Normal cardiovascular exam Rhythm:regular Rate:Normal     Neuro/Psych Seizures -,   Neuromuscular disease  negative psych ROS   GI/Hepatic Neg liver ROS,GERD  Medicated,,  Endo/Other  negative endocrine ROS    Renal/GU negative Renal ROS  negative genitourinary   Musculoskeletal   Abdominal   Peds  Hematology negative hematology ROS (+)   Anesthesia Other Findings Past Medical History: No date: Arthritis No date: Asthma No date: COPD (chronic obstructive pulmonary disease) (HCC) No date: Dyspnea No date: GERD (gastroesophageal reflux disease) No date: Hypercholesterolemia No date: Hypertension No date: Melanoma ([place])     Comment:  approximately 8 years ago resected from Right lower leg. No date: Multiple thyroid nodules 2013: Seizure ([place])     Comment:  x 1-unsure ot the cause No date: Wears dentures     Comment:  partial upper and lower Past Surgical History: No date: CATARACT EXTRACTION; Bilateral No date: CHOLECYSTECTOMY No date: COLONOSCOPY 12/26/2021: LUMBAR LAMINECTOMY/DECOMPRESSION MICRODISCECTOMY; Right     Comment:  Procedure: RIGHT L4-5 LAMINOFORAMINOTOMY;  Surgeon:               Meade Maw, MD;  Location: [facility];  Service:               Neurosurgery;  Laterality: Right; No date: TONSILLECTOMY No date: TUBAL LIGATION BMI    Body Mass Index: 32.43  kg/m     Reproductive/Obstetrics negative OB ROS                             Anesthesia Physical Anesthesia Plan  ASA: 3  Anesthesia Plan: General   Post-op Pain Management:    Induction:   PONV Risk Score and Plan:   Airway Management Planned:   Additional Equipment:   Intra-op Plan:   Post-operative Plan:   Informed Consent: I have reviewed the patients History and Physical, chart, labs and discussed the procedure including the risks, benefits and alternatives for the proposed anesthesia with the patient or authorized representative who has indicated his/her understanding and acceptance.     Dental Advisory Given  Plan Discussed with: CRNA  Anesthesia Plan Comments:        Anesthesia Quick Evaluation

## 2023-12-06 ENCOUNTER — Encounter
Admission: RE | Disposition: A | Discharge status: Home / Self Care | Payer: Self-pay | Source: Home / Self Care | Attending: Gastroenterology

## 2023-12-06 ENCOUNTER — Ambulatory Visit: Payer: Self-pay | Admitting: Anesthesiology

## 2023-12-06 ENCOUNTER — Other Ambulatory Visit: Payer: Self-pay | Admitting: Gastroenterology

## 2023-12-06 ENCOUNTER — Encounter: Payer: Self-pay | Admitting: Anesthesiology

## 2023-12-06 ENCOUNTER — Other Ambulatory Visit: Payer: Self-pay

## 2023-12-06 ENCOUNTER — Encounter: Payer: Self-pay | Admitting: Gastroenterology

## 2023-12-06 ENCOUNTER — Ambulatory Visit
Admission: RE | Admit: 2023-12-06 | Discharge: 2023-12-06 | Disposition: A | Discharge status: Home / Self Care | Attending: Gastroenterology | Admitting: Gastroenterology

## 2023-12-06 DIAGNOSIS — R1013 Epigastric pain: Secondary | ICD-10-CM | POA: Diagnosis present

## 2023-12-06 DIAGNOSIS — I10 Essential (primary) hypertension: Secondary | ICD-10-CM | POA: Diagnosis not present

## 2023-12-06 DIAGNOSIS — J4489 Other specified chronic obstructive pulmonary disease: Secondary | ICD-10-CM | POA: Insufficient documentation

## 2023-12-06 DIAGNOSIS — Z79899 Other long term (current) drug therapy: Secondary | ICD-10-CM | POA: Insufficient documentation

## 2023-12-06 DIAGNOSIS — K219 Gastro-esophageal reflux disease without esophagitis: Secondary | ICD-10-CM | POA: Diagnosis not present

## 2023-12-06 DIAGNOSIS — Z87891 Personal history of nicotine dependence: Secondary | ICD-10-CM | POA: Insufficient documentation

## 2023-12-06 DIAGNOSIS — R140 Abdominal distension (gaseous): Secondary | ICD-10-CM | POA: Diagnosis not present

## 2023-12-06 DIAGNOSIS — K449 Diaphragmatic hernia without obstruction or gangrene: Secondary | ICD-10-CM | POA: Insufficient documentation

## 2023-12-06 HISTORY — PX: ESOPHAGOGASTRODUODENOSCOPY: SHX5428

## 2023-12-06 SURGERY — EGD (ESOPHAGOGASTRODUODENOSCOPY)
Anesthesia: General

## 2023-12-06 MED ORDER — PROPOFOL 10 MG/ML IV BOLUS
INTRAVENOUS | Status: DC | PRN
  Administered 2023-12-06: 20 mg via INTRAVENOUS
  Administered 2023-12-06: 100 mg via INTRAVENOUS

## 2023-12-06 MED ORDER — SODIUM CHLORIDE 0.9 % IV SOLN: INTRAVENOUS | Status: DC

## 2023-12-06 MED ORDER — LIDOCAINE HCL (CARDIAC) PF 100 MG/5ML IV SOSY
PREFILLED_SYRINGE | INTRAVENOUS | Status: DC | PRN
  Administered 2023-12-06: 80 mg via INTRAVENOUS

## 2023-12-06 MED ORDER — PROPOFOL 1000 MG/100ML IV EMUL
INTRAVENOUS | Status: AC
  Filled 2023-12-06: qty 200

## 2023-12-06 MED ORDER — SODIUM CHLORIDE 0.9 % IV SOLN
INTRAVENOUS | Status: DC | PRN
Start: 2023-12-06 — End: 2023-12-06

## 2023-12-06 NOTE — Op Note (Signed)
 [facility] Gastroenterology Patient Name: Veronica Kennedy Procedure Date: 12/06/2023 7:16 AM MRN: [id number] Account #: [id number] Date of Birth: 03-Mar-1956 Admit Type: Outpatient Age: 68 Room: [facility] ENDO ROOM 3 Gender: Female Note Status: Finalized Instrument Name: Upper GI Scope [id number] Procedure:             Upper GI endoscopy Indications:           Epigastric abdominal pain, Abdominal bloating Providers:             Corinn Jess Brooklyn MD, MD Referring MD:          Corinn Jess Brooklyn MD, MD (Referring MD), Ike EMERSON Lavender MD, MD (Referring MD) Medicines:             General Anesthesia Complications:         No immediate complications. Estimated blood loss: None. Procedure:             Pre-Anesthesia Assessment:                        - Prior to the procedure, a History and Physical was                         performed, and patient medications and allergies were                         reviewed. The patient is competent. The risks and                         benefits of the procedure and the sedation options and                         risks were discussed with the patient. All questions                         were answered and informed consent was obtained.                         Patient identification and proposed procedure were                         verified by the physician, the nurse, the                         anesthesiologist, the anesthetist and the technician                         in the pre-procedure area in the procedure room in the                         endoscopy suite. Mental Status Examination: alert and                         oriented. Airway Examination: normal oropharyngeal                         airway and neck mobility. Respiratory Examination:  clear to auscultation. CV Examination: normal.                         Prophylactic Antibiotics: The patient does not require                          prophylactic antibiotics. Prior Anticoagulants: The                         patient has taken no anticoagulant or antiplatelet                         agents. ASA Grade Assessment: III - A patient with                         severe systemic disease. After reviewing the risks and                         benefits, the patient was deemed in satisfactory                         condition to undergo the procedure. The anesthesia                         plan was to use general anesthesia. Immediately prior                         to administration of medications, the patient was                         re-assessed for adequacy to receive sedatives. The                         heart rate, respiratory rate, oxygen saturations,                         blood pressure, adequacy of pulmonary ventilation, and                         response to care were monitored throughout the                         procedure. The physical status of the patient was                         re-assessed after the procedure.                        After obtaining informed consent, the endoscope was                         passed under direct vision. Throughout the procedure,                         the patient's blood pressure, pulse, and oxygen                         saturations were monitored continuously. The Endoscope  was introduced through the mouth, and advanced to the                         second part of duodenum. The upper GI endoscopy was                         accomplished without difficulty. The patient tolerated                         the procedure well. Findings:      The duodenal bulb and second portion of the duodenum were normal.       Biopsies were taken with a cold forceps for histology.      The entire examined stomach was normal. Biopsies were taken with a cold       forceps for histology.      A small hiatal hernia was present.      The cardia and gastric fundus  were normal on retroflexion.      The gastroesophageal junction and examined esophagus were normal. Impression:            - Normal duodenal bulb and second portion of the                         duodenum. Biopsied.                        - Normal stomach. Biopsied.                        - Small hiatal hernia.                        - Normal gastroesophageal junction and esophagus. Recommendation:        - Await pathology results.                        - Discharge patient to home (with escort).                        - Resume previous diet today.                        - Continue present medications.                        - Continue present medications.                        - Follow an antireflux regimen indefinitely. Procedure Code(s):     --- Professional ---                        [phone], Esophagogastroduodenoscopy, flexible,                         transoral; with biopsy, single or multiple Diagnosis Code(s):     --- Professional ---                        K44.9, Diaphragmatic hernia without obstruction or  gangrene                        R10.13, Epigastric pain                        R14.0, Abdominal distension (gaseous) CPT copyright 2022 American Medical Association. All rights reserved. The codes documented in this report are preliminary and upon coder review may  be revised to meet current compliance requirements. Dr. Corinn Brooklyn Corinn Jess Brooklyn MD, MD 12/06/2023 7:52:34 AM This report has been signed electronically. Number of Addenda: 0 Note Initiated On: 12/06/2023 7:16 AM Estimated Blood Loss:  Estimated blood loss: none.      [facility]

## 2023-12-06 NOTE — H&P (Signed)
 Corinn JONELLE Brooklyn, MD [facility] Gastroenterology, DHIP [address]  [place], [place] [address]  Main: [phone] Fax:  [phone] Pager: [phone]   Primary Care Physician:  Rojelio Loader, MD Primary Gastroenterologist:  Dr. Corinn JONELLE Brooklyn  Pre-Procedure History & Physical: HPI:  Veronica Kennedy is a 67 y.o. female is here for an endoscopy.   Past Medical History:  Diagnosis Date   Arthritis    Asthma    COPD (chronic obstructive pulmonary disease) (HCC)    Dyspnea    GERD (gastroesophageal reflux disease)    Hypercholesterolemia    Hypertension    Melanoma (HCC)    approximately 8 years ago resected from Right lower leg.    Multiple thyroid nodules    Seizure (HCC) 2013   x 1-unsure ot the cause   Wears dentures    partial upper and lower    Past Surgical History:  Procedure Laterality Date   BACK SURGERY     CATARACT EXTRACTION Bilateral    CHOLECYSTECTOMY     COLONOSCOPY     COLONOSCOPY WITH PROPOFOL  N/A 04/20/2022   Procedure: COLONOSCOPY WITH BIOPSY;  Surgeon: Jinny Carmine, MD;  Location: [facility];  Service: Endoscopy;  Laterality: N/A;   ESOPHAGOGASTRODUODENOSCOPY (EGD) WITH PROPOFOL  N/A 04/20/2022   Procedure: ESOPHAGOGASTRODUODENOSCOPY (EGD) WITH PROPOFOL ;  Surgeon: Jinny Carmine, MD;  Location: [facility];  Service: Endoscopy;  Laterality: N/A;   LUMBAR LAMINECTOMY/DECOMPRESSION MICRODISCECTOMY Right 12/26/2021   Procedure: RIGHT L4-5 LAMINOFORAMINOTOMY;  Surgeon: Clois Fret, MD;  Location: [facility] OR[facility];  Service: Neurosurgery;  Laterality: Right;   POLYPECTOMY N/A 04/20/2022   Procedure: POLYPECTOMY;  Surgeon: Jinny Carmine, MD;  Location: [facility];  Service: Endoscopy;  Laterality: N/A;   TONSILLECTOMY     TUBAL LIGATION      Prior to Admission medications   Medication Sig Start Date End Date Taking? Authorizing Provider  atorvastatin  (LIPITOR) 10 MG tablet Take 1 tablet (10 mg total) by mouth  daily. Patient taking differently: Take 10 mg by mouth every morning. 11/08/12  Yes Glendia Shad, MD  Calcium  Carb-Cholecalciferol (CALCIUM  500 + D PO) Take 1 tablet by mouth daily at 6 (six) AM.   Yes [provider]  celecoxib  (CELEBREX ) 100 MG capsule Take 1-2 tabs po bid prn pain 06/01/23  Yes Arvis Jolan NOVAK, PA-C  Cholecalciferol (D 1000) 25 MCG (1000 UT) capsule Take by mouth.   Yes [provider]  citalopram  (CELEXA ) 40 MG tablet Take 1.5 tablets (60 mg total) by mouth daily. Patient taking differently: Take 40 mg by mouth every morning. 09/21/12  Yes Glendia Shad, MD  cyanocobalamin (VITAMIN B12) 1000 MCG tablet Take by mouth.   Yes [provider]  cyclobenzaprine  (FLEXERIL ) 10 MG tablet Take 1 tablet (10 mg total) by mouth 3 (three) times daily as needed for muscle spasms. Do not drive while taking as can cause drowsiness 02/11/21  Yes Arvis Jolan B, PA-C  Fluticasone -Salmeterol (ADVAIR) 250-50 MCG/DOSE AEPB Inhale 1 puff into the lungs every morning. 05/24/18  Yes [provider]  pantoprazole  (PROTONIX ) 40 MG tablet 40 mg 2 (two) times daily. 01/07/18  Yes [provider]  ramipril  (ALTACE ) 5 MG capsule Take 5 mg by mouth every morning. 10/10/19  Yes [provider]  sucralfate  (CARAFATE ) 1 g tablet Take 1 g by mouth 2 (two) times daily.   Yes [provider]  traZODone  (DESYREL ) 150 MG tablet TAKE 1 TABLET (150 MG TOTAL) AT BEDTIME Patient taking  differently: Take 200 mg by mouth at bedtime. 01/07/13  Yes Glendia Shad, MD  albuterol  (PROVENTIL  HFA;VENTOLIN  HFA) 108 (90 BASE) MCG/ACT inhaler Inhale 2 puffs into the lungs every 6 (six) hours as needed for wheezing.    [provider]  hyoscyamine (LEVSIN) 0.125 MG tablet Take 0.125 mg by mouth every 4 (four) hours as needed for cramping.    [provider]  ibuprofen  (ADVIL ) 600 MG tablet Take 1 tablet (600 mg total) by mouth every 8 (eight) hours as  needed. 04/22/23   Van Knee, MD  sucralfate  (CARAFATE ) 1 g tablet Take 1 tablet (1 g total) by mouth 4 (four) times daily -  with meals and at bedtime for 14 days. 11/16/23 11/30/23  Ernest Ronal BRAVO, MD  SUMAtriptan  (IMITREX ) 25 MG tablet May repeat in 2 hours if headache persists or recurs. Patient taking differently: Take 25 mg by mouth every 2 (two) hours as needed. May repeat in 2 hours if headache persists or recurs. 02/11/21   Arvis Jolan NOVAK, PA-C  umeclidinium bromide (INCRUSE ELLIPTA) 62.5 MCG/ACT AEPB Inhale 1 puff into the lungs every morning.    [provider]  esomeprazole  (NEXIUM ) 40 MG capsule Take 1 capsule (40 mg total) by mouth daily. 12/02/12 10/28/18  Glendia Shad, MD  fluticasone  (FLONASE ) 50 MCG/ACT nasal spray Place 2 sprays into both nostrils daily. 07/15/15 10/28/18  Desiderio Beagle, MD  hydrochlorothiazide  (HYDRODIURIL ) 25 MG tablet Take 1 tablet (25 mg total) by mouth daily. 09/30/12 04/12/20  Glendia Shad, MD    Allergies as of 11/29/2023 - Review Complete 11/16/2023  Allergen Reaction Noted   Other  07/21/2020    Family History  Problem Relation Age of Onset   Breast cancer Maternal Aunt 72   Dementia Mother    Heart disease Mother    Diabetes Mother    Heart attack Father    AAA (abdominal aortic aneurysm) Father    Hypertension Father    Hyperlipidemia Father     Social History   Socioeconomic History   Marital status: Married    Spouse name: Not on file   Number of children: Not on file   Years of education: Not on file   Highest education level: Not on file  Occupational History   Not on file  Tobacco Use   Smoking status: Former    Current packs/day: 0.00    Types: Cigarettes    Quit date: 06/08/2005    Years since quitting: 18.5   Smokeless tobacco: Never  Vaping Use   Vaping status: Never Used  Substance and Sexual Activity   Alcohol use: Yes    Comment: occasional   Drug use: No   Sexual activity: Not on file  Other  Topics Concern   Not on file  Social History Narrative   Not on file   Social Drivers of Health   Financial Resource Strain: Low Risk  (02/25/2023)   Received from [facility]   Overall Financial Resource Strain (CARDIA)    Difficulty of Paying Living Expenses: Not hard at all  Food Insecurity: No Food Insecurity (02/25/2023)   Received from [facility]   Hunger Vital Sign    Within the past 12 months, you worried that your food would run out before you got the money to buy more.: Never true    Within the past 12 months, the food you bought just didn't last and you didn't have money to get more.: Never true  Transportation Needs: No Transportation Needs (02/25/2023)   Received from [facility] - Transportation    In the past 12 months, has lack of transportation kept you from medical appointments or from getting medications?: No    Lack of Transportation (Non-Medical): No  Physical Activity: Not on file  Stress: Not on file  Social Connections: Not on file  Intimate Partner Violence: Not on file    Review of Systems: See HPI, otherwise negative ROS  Physical Exam: BP 106/71   Pulse 64   Temp (!) 96.9 F (36.1 C) (Temporal)   Resp 20   Ht 5' 4 (1.626 m)   Wt 75.1 kg   SpO2 98%   BMI 28.43 kg/m  General:   Alert,  pleasant and cooperative in NAD Head:  Normocephalic and atraumatic. Neck:  Supple; no masses or thyromegaly. Lungs:  Clear throughout to auscultation.    Heart:  Regular rate and rhythm. Abdomen:  Soft, nontender and nondistended. Normal bowel sounds, without guarding, and without rebound.   Neurologic:  Alert and  oriented x4;  grossly normal neurologically.  Impression/Plan: Veronica Kennedy is here for an endoscopy to be performed for Chronic epigastric pain constant, worse postprandial associated with nausea and postprandial abdominal bloating   Risks, benefits, limitations, and  alternatives regarding  endoscopy have been reviewed with the patient.  Questions have been answered.  All parties agreeable.   Corinn Brooklyn, MD  12/06/2023, 7:38 AM

## 2023-12-06 NOTE — Transfer of Care (Signed)
 Immediate Anesthesia Transfer of Care Note  Patient: Veronica Kennedy  Procedure(s) Performed: EGD (ESOPHAGOGASTRODUODENOSCOPY)  Patient Location: Endoscopy Unit  Anesthesia Type:General  Level of Consciousness: drowsy  Airway & Oxygen Therapy: Patient Spontanous Breathing and Patient connected to nasal cannula oxygen  Post-op Assessment: Report given to RN  Post vital signs: stable  Last Vitals:  Vitals Value Taken Time  BP    Temp    Pulse    Resp    SpO2      Last Pain:  Vitals:   12/06/23 0724  TempSrc: Temporal  PainSc: 0-No pain         Complications: No notable events documented.

## 2023-12-06 NOTE — Anesthesia Postprocedure Evaluation (Signed)
 Anesthesia Post Note  Patient: Veronica Kennedy  Procedure(s) Performed: EGD (ESOPHAGOGASTRODUODENOSCOPY)  Patient location during evaluation: Endoscopy Anesthesia Type: General Level of consciousness: awake and alert Pain management: pain level controlled Vital Signs Assessment: post-procedure vital signs reviewed and stable Respiratory status: spontaneous breathing, nonlabored ventilation and respiratory function stable Cardiovascular status: blood pressure returned to baseline and stable Postop Assessment: no apparent nausea or vomiting Anesthetic complications: no   No notable events documented.   Last Vitals:  Vitals:   12/06/23 0802 12/06/23 0812  BP: 112/64 128/71  Pulse: 65 65  Resp: 15 15  Temp:    SpO2: 97% 99%    Last Pain:  Vitals:   12/06/23 0802  TempSrc:   PainSc: 0-No pain                 Camellia Merilee Louder

## 2023-12-07 LAB — SURGICAL PATHOLOGY

## 2023-12-11 ENCOUNTER — Ambulatory Visit: Payer: Self-pay | Admitting: Gastroenterology

## 2023-12-14 ENCOUNTER — Other Ambulatory Visit: Payer: Self-pay | Admitting: Gastroenterology

## 2023-12-14 DIAGNOSIS — R1013 Epigastric pain: Secondary | ICD-10-CM

## 2023-12-27 ENCOUNTER — Encounter: Payer: Self-pay | Admitting: Radiology

## 2024-01-04 ENCOUNTER — Other Ambulatory Visit

## 2024-01-11 ENCOUNTER — Other Ambulatory Visit

## 2024-02-02 ENCOUNTER — Ambulatory Visit
Admission: RE | Admit: 2024-02-02 | Discharge: 2024-02-02 | Disposition: A | Discharge status: Home / Self Care | Source: Ambulatory Visit | Attending: Student

## 2024-02-02 ENCOUNTER — Ambulatory Visit (INDEPENDENT_AMBULATORY_CARE_PROVIDER_SITE_OTHER)

## 2024-02-02 VITALS — BP 121/78 | HR 89 | Temp 98.7°F | Resp 17 | Wt 160.0 lb

## 2024-02-02 DIAGNOSIS — M25562 Pain in left knee: Secondary | ICD-10-CM | POA: Diagnosis not present

## 2024-02-02 MED ORDER — METHYLPREDNISOLONE 4 MG PO TBPK: ORAL_TABLET | Freq: Every day | ORAL | 0 refills | Status: DC

## 2024-02-02 MED ORDER — DEXAMETHASONE SOD PHOSPHATE PF 10 MG/ML IJ SOLN
10.0000 mg | Freq: Once | INTRAMUSCULAR | Status: AC
  Administered 2024-02-02: 10 mg via INTRAMUSCULAR

## 2024-02-02 NOTE — ED Triage Notes (Signed)
 Patient states that she's having left knee pain x 3 days. Patient states that the area feels hot to the touch. Patient states that theres no injury. 8/10 pain. Took meloxicam that didn't help

## 2024-02-02 NOTE — ED Provider Notes (Addendum)
 [facility]    CSN: [id number] Arrival date & time: 02/02/24  1320      History   Chief Complaint Chief Complaint  Patient presents with   Knee Pain    Entered by patient    HPI Veronica Kennedy is a 68 y.o. female presenting with left knee pain for 3 days.  Medical history COPD, hypertension, right knee osteoarthritis? for which she receives steroid injections.  The knee pain is nontraumatic.  She has been walking on her treadmill, but denies prolonged standing or walking otherwise.  Denies radiation of the pain.  Denies calf swelling or pain.  Has attempted meloxicam at home without relief.  She called her orthopedist, but he cannot see her for 2 weeks.  HPI  Past Medical History:  Diagnosis Date   Arthritis    Asthma    COPD (chronic obstructive pulmonary disease) (HCC)    Dyspnea    GERD (gastroesophageal reflux disease)    Hypercholesterolemia    Hypertension    Melanoma (HCC)    approximately 8 years ago resected from Right lower leg.    Multiple thyroid nodules    Seizure (HCC) 2013   x 1-unsure ot the cause   Wears dentures    partial upper and lower    Patient Active Problem List   Diagnosis Date Noted   Special screening for malignant neoplasms, colon 04/20/2022   Polyp of colon 04/20/2022   Epigastric pain 04/20/2022   Lumbar radiculopathy    GERD (gastroesophageal reflux disease) 09/21/2012   Stress 09/21/2012   Essential hypertension, benign 09/19/2012   Hypercholesterolemia 09/19/2012    Past Surgical History:  Procedure Laterality Date   BACK SURGERY     CATARACT EXTRACTION Bilateral    CHOLECYSTECTOMY     COLONOSCOPY     COLONOSCOPY WITH PROPOFOL  N/A 04/20/2022   Procedure: COLONOSCOPY WITH BIOPSY;  Surgeon: Jinny Carmine, MD;  Location: [facility];  Service: Endoscopy;  Laterality: N/A;   ESOPHAGOGASTRODUODENOSCOPY N/A 12/06/2023   Procedure: EGD (ESOPHAGOGASTRODUODENOSCOPY);  Surgeon: Unk Corinn Skiff, MD;  Location:  [facility];  Service: Gastroenterology;  Laterality: N/A;   ESOPHAGOGASTRODUODENOSCOPY (EGD) WITH PROPOFOL  N/A 04/20/2022   Procedure: ESOPHAGOGASTRODUODENOSCOPY (EGD) WITH PROPOFOL ;  Surgeon: Jinny Carmine, MD;  Location: [facility];  Service: Endoscopy;  Laterality: N/A;   LUMBAR LAMINECTOMY/DECOMPRESSION MICRODISCECTOMY Right 12/26/2021   Procedure: RIGHT L4-5 LAMINOFORAMINOTOMY;  Surgeon: Clois Fret, MD;  Location: [facility];  Service: Neurosurgery;  Laterality: Right;   POLYPECTOMY N/A 04/20/2022   Procedure: POLYPECTOMY;  Surgeon: Jinny Carmine, MD;  Location: [facility];  Service: Endoscopy;  Laterality: N/A;   TONSILLECTOMY     TUBAL LIGATION      OB History   No obstetric history on file.      Home Medications    Prior to Admission medications   Medication Sig Start Date End Date Taking? Authorizing Provider  albuterol  (PROVENTIL  HFA;VENTOLIN  HFA) 108 (90 BASE) MCG/ACT inhaler Inhale 2 puffs into the lungs every 6 (six) hours as needed for wheezing.   Yes [provider]  atorvastatin  (LIPITOR) 10 MG tablet Take 1 tablet (10 mg total) by mouth daily. Patient taking differently: Take 10 mg by mouth every morning. 11/08/12  Yes Glendia Shad, MD  Calcium  Carb-Cholecalciferol (CALCIUM  500 + D PO) Take 1 tablet by mouth daily at 6 (six) AM.   Yes [provider]  celecoxib  (CELEBREX ) 100 MG capsule Take 1-2 tabs po bid prn pain 06/01/23  Yes Arvis,  Jolan NOVAK, PA-C  citalopram  (CELEXA ) 40 MG tablet Take 1.5 tablets (60 mg total) by mouth daily. Patient taking differently: Take 40 mg by mouth every morning. 09/21/12  Yes Glendia Shad, MD  methylPREDNISolone  (MEDROL  DOSEPAK) 4 MG TBPK tablet Take by mouth daily. 02/02/24  Yes Adhrit Krenz E, PA-C  Cholecalciferol (D 1000) 25 MCG (1000 UT) capsule Take by mouth.    [provider]  cyanocobalamin (VITAMIN B12) 1000 MCG tablet Take by mouth.    [provider]   cyclobenzaprine  (FLEXERIL ) 10 MG tablet Take 1 tablet (10 mg total) by mouth 3 (three) times daily as needed for muscle spasms. Do not drive while taking as can cause drowsiness 02/11/21   Arvis Jolan B, PA-C  Fluticasone -Salmeterol (ADVAIR) 250-50 MCG/DOSE AEPB Inhale 1 puff into the lungs every morning. 05/24/18   [provider]  hyoscyamine (LEVSIN) 0.125 MG tablet Take 0.125 mg by mouth every 4 (four) hours as needed for cramping.    [provider]  ibuprofen  (ADVIL ) 600 MG tablet Take 1 tablet (600 mg total) by mouth every 8 (eight) hours as needed. 04/22/23   Van Knee, MD  pantoprazole  (PROTONIX ) 40 MG tablet 40 mg 2 (two) times daily. 01/07/18   [provider]  ramipril  (ALTACE ) 5 MG capsule Take 5 mg by mouth every morning. 10/10/19   [provider]  sucralfate  (CARAFATE ) 1 g tablet Take 1 tablet (1 g total) by mouth 4 (four) times daily -  with meals and at bedtime for 14 days. 11/16/23 11/30/23  Ernest Ronal BRAVO, MD  sucralfate  (CARAFATE ) 1 g tablet Take 1 g by mouth 2 (two) times daily.    [provider]  SUMAtriptan  (IMITREX ) 25 MG tablet May repeat in 2 hours if headache persists or recurs. Patient taking differently: Take 25 mg by mouth every 2 (two) hours as needed. May repeat in 2 hours if headache persists or recurs. 02/11/21   Arvis Jolan NOVAK, PA-C  traZODone  (DESYREL ) 150 MG tablet TAKE 1 TABLET (150 MG TOTAL) AT BEDTIME Patient taking differently: Take 200 mg by mouth at bedtime. 01/07/13   Glendia Shad, MD  umeclidinium bromide (INCRUSE ELLIPTA) 62.5 MCG/ACT AEPB Inhale 1 puff into the lungs every morning.    [provider]  esomeprazole  (NEXIUM ) 40 MG capsule Take 1 capsule (40 mg total) by mouth daily. 12/02/12 10/28/18  Glendia Shad, MD  fluticasone  (FLONASE ) 50 MCG/ACT nasal spray Place 2 sprays into both nostrils daily. 07/15/15 10/28/18  Desiderio Beagle, MD  hydrochlorothiazide  (HYDRODIURIL ) 25 MG tablet Take 1  tablet (25 mg total) by mouth daily. 09/30/12 04/12/20  Glendia Shad, MD    Family History Family History  Problem Relation Age of Onset   Breast cancer Maternal Aunt 5   Dementia Mother    Heart disease Mother    Diabetes Mother    Heart attack Father    AAA (abdominal aortic aneurysm) Father    Hypertension Father    Hyperlipidemia Father     Social History Social History   Tobacco Use   Smoking status: Former    Current packs/day: 0.00    Types: Cigarettes    Quit date: 06/08/2005    Years since quitting: 18.6   Smokeless tobacco: Never  Vaping Use   Vaping status: Never Used  Substance Use Topics   Alcohol use: Yes    Comment: occasional   Drug use: No     Allergies   Other   Review of Systems Review  of Systems  Musculoskeletal:  Positive for joint swelling.     Physical Exam Triage Vital Signs ED Triage Vitals  Encounter Vitals Group     BP 02/02/24 1343 121/78     Girls Systolic BP Percentile --      Girls Diastolic BP Percentile --      Boys Systolic BP Percentile --      Boys Diastolic BP Percentile --      Pulse Rate 02/02/24 1343 89     Resp 02/02/24 1343 17     Temp 02/02/24 1343 98.7 F (37.1 C)     Temp Source 02/02/24 1343 Oral     SpO2 02/02/24 1343 94 %     Weight 02/02/24 1342 160 lb (72.6 kg)     Height --      Head Circumference --      Peak Flow --      Pain Score 02/02/24 1342 8     Pain Loc --      Pain Education --      Exclude from Growth Chart --    No data found.  Updated Vital Signs BP 121/78 (BP Location: Right Arm)   Pulse 89   Temp 98.7 F (37.1 C) (Oral)   Resp 17   Wt 160 lb (72.6 kg)   SpO2 94%   BMI 27.46 kg/m   Visual Acuity Right Eye Distance:   Left Eye Distance:   Bilateral Distance:    Right Eye Near:   Left Eye Near:    Bilateral Near:     Physical Exam Vitals reviewed.  Constitutional:      General: She is not in acute distress.    Appearance: Normal appearance. She is not  ill-appearing.     Comments: Patient sitting comfortably in exam room, in no acute distress  HENT:     Head: Normocephalic and atraumatic.  Pulmonary:     Effort: Pulmonary effort is normal.  Musculoskeletal:     Comments: Left knee: mild effusion laterally, with no skin changes or warmth. Stiffness and crepitus with extension. No joint laxity.  Knees are both 38.5 cm and symmetric.  No calf swelling, distention.  Patient in wheelchair  Neurological:     General: No focal deficit present.     Mental Status: She is alert and oriented to person, place, and time.  Psychiatric:        Mood and Affect: Mood normal.        Behavior: Behavior normal.        Thought Content: Thought content normal.        Judgment: Judgment normal.      UC Treatments / Results  Labs (all labs ordered are listed, but only abnormal results are displayed) Labs Reviewed - No data to display  EKG   Radiology DG Knee 2 Views Left Result Date: 02/02/2024 CLINICAL DATA:  Left knee pain for 3 days, warm to the touch EXAM: LEFT KNEE - 1-2 VIEW COMPARISON:  None Available. FINDINGS: Frontal and lateral views of the left knee are obtained on 2 images. No acute fracture, subluxation, or dislocation. Joint spaces are well preserved. Small suprapatellar joint effusion. Soft tissues are otherwise unremarkable. IMPRESSION: 1. Small suprapatellar joint effusion. 2. No acute bony abnormality. Electronically Signed   By: Ozell Daring M.D.   On: 02/02/2024 14:31    Procedures Procedures (including critical care time)  Medications Ordered in UC Medications  dexamethasone  (DECADRON ) injection 10 mg (has no administration  in time range)    Initial Impression / Assessment and Plan / UC Course  I have reviewed the triage vital signs and the nursing notes.  Pertinent labs & imaging results that were available during my care of the patient were reviewed by me and considered in my medical decision making (see chart for  details).     Patient is a pleasant 68 year old female presenting with nontraumatic left knee pain.  On exam, there was mild effusion and crepitus, so osteoarthritis is likely diagnosis.  Xray L knee:  1. Small suprapatellar joint effusion. 2. No acute bony abnormality.  Wells score for DVT -2, ultrasound not indicated today.  Will manage with Decadron  IM in clinic today, and Medrol  Dosepak starting tomorrow.  She is not a diabetic. She can continue RICE, Tylenol /meloxicam.  She has a follow-up scheduled with her orthopedist on 02/12/2024 and I encouraged her to keep this appointment.  Final Clinical Impressions(s) / UC Diagnoses   Final diagnoses:  Acute pain of left knee     Discharge Instructions      - Starting tomorrow, take the Medrol  Dosepak as directed.  This is a steroid that will help with your knee pain. -The steroid injection we gave you today should also help with your pain. -Continue Tylenol /meloxicam, make sure to take meloxicam with food. -Keep your scheduled appointment with your orthopedist on 02/12/2024.     ED Prescriptions     Medication Sig Dispense Auth. Provider   methylPREDNISolone  (MEDROL  DOSEPAK) 4 MG TBPK tablet Take by mouth daily. 1 each Arlyss Leita BRAVO, PA-C      PDMP not reviewed this encounter.   Arlyss Leita BRAVO, PA-C 02/02/24 1445    Arlyss Leita BRAVO, PA-C 02/02/24 [phone]

## 2024-02-02 NOTE — Discharge Instructions (Addendum)
-   Starting tomorrow, take the Medrol  Dosepak as directed.  This is a steroid that will help with your knee pain. -The steroid injection we gave you today should also help with your pain. -Continue Tylenol /meloxicam, make sure to take meloxicam with food. -Keep your scheduled appointment with your orthopedist on 02/12/2024.

## 2024-02-04 ENCOUNTER — Other Ambulatory Visit: Payer: Self-pay | Admitting: "Endocrinology

## 2024-02-04 DIAGNOSIS — E0590 Thyrotoxicosis, unspecified without thyrotoxic crisis or storm: Secondary | ICD-10-CM

## 2024-02-04 DIAGNOSIS — E042 Nontoxic multinodular goiter: Secondary | ICD-10-CM

## 2024-02-14 ENCOUNTER — Other Ambulatory Visit: Payer: Self-pay | Admitting: Orthopedic Surgery

## 2024-02-14 DIAGNOSIS — M25462 Effusion, left knee: Secondary | ICD-10-CM

## 2024-02-14 DIAGNOSIS — M2392 Unspecified internal derangement of left knee: Secondary | ICD-10-CM

## 2024-02-15 ENCOUNTER — Encounter: Payer: Self-pay | Admitting: Orthopedic Surgery

## 2024-02-18 ENCOUNTER — Ambulatory Visit
Admission: RE | Admit: 2024-02-18 | Discharge: 2024-02-18 | Disposition: A | Discharge status: Home / Self Care | Source: Ambulatory Visit | Attending: "Endocrinology | Admitting: "Endocrinology

## 2024-02-18 DIAGNOSIS — E042 Nontoxic multinodular goiter: Secondary | ICD-10-CM | POA: Insufficient documentation

## 2024-02-18 DIAGNOSIS — E0590 Thyrotoxicosis, unspecified without thyrotoxic crisis or storm: Secondary | ICD-10-CM | POA: Insufficient documentation

## 2024-02-22 ENCOUNTER — Ambulatory Visit
Admission: RE | Admit: 2024-02-22 | Discharge: 2024-02-22 | Disposition: A | Discharge status: Home / Self Care | Source: Ambulatory Visit | Attending: Orthopedic Surgery | Admitting: Orthopedic Surgery

## 2024-02-22 DIAGNOSIS — M2392 Unspecified internal derangement of left knee: Secondary | ICD-10-CM

## 2024-02-22 DIAGNOSIS — M25462 Effusion, left knee: Secondary | ICD-10-CM

## 2024-02-23 ENCOUNTER — Other Ambulatory Visit

## 2024-03-03 ENCOUNTER — Other Ambulatory Visit: Payer: Self-pay | Admitting: Orthopedic Surgery

## 2024-03-05 ENCOUNTER — Other Ambulatory Visit: Payer: Self-pay | Admitting: Orthopedic Surgery

## 2024-03-17 ENCOUNTER — Ambulatory Visit: Admission: RE | Admit: 2024-03-17 | Source: Home / Self Care | Admitting: Orthopedic Surgery

## 2024-03-17 ENCOUNTER — Encounter: Admission: RE | Payer: Self-pay | Source: Home / Self Care

## 2024-03-17 SURGERY — ARTHROSCOPY, KNEE, WITH LATERAL MENISCECTOMY
Anesthesia: Choice | Site: Knee | Laterality: Left

## 2024-04-12 ENCOUNTER — Ambulatory Visit

## 2024-04-12 ENCOUNTER — Ambulatory Visit
Admission: RE | Admit: 2024-04-12 | Discharge: 2024-04-12 | Disposition: A | Discharge status: Home / Self Care | Payer: Self-pay | Source: Ambulatory Visit | Attending: Emergency Medicine | Admitting: Emergency Medicine

## 2024-04-12 VITALS — BP 108/59 | HR 96 | Temp 98.7°F | Resp 20 | Ht 64.0 in | Wt 161.9 lb

## 2024-04-12 DIAGNOSIS — R051 Acute cough: Secondary | ICD-10-CM | POA: Diagnosis not present

## 2024-04-12 DIAGNOSIS — J441 Chronic obstructive pulmonary disease with (acute) exacerbation: Secondary | ICD-10-CM | POA: Diagnosis not present

## 2024-04-12 LAB — POCT INFLUENZA A/B
Influenza A, POC: NEGATIVE
Influenza B, POC: NEGATIVE

## 2024-04-12 LAB — POC SOFIA SARS ANTIGEN FIA: SARS Coronavirus 2 Ag: NEGATIVE

## 2024-04-12 MED ORDER — AMOXICILLIN-POT CLAVULANATE 875-125 MG PO TABS: 1.0000 | ORAL_TABLET | Freq: Two times a day (BID) | ORAL | 0 refills | Status: AC

## 2024-04-12 MED ORDER — BENZONATATE 100 MG PO CAPS: 200.0000 mg | ORAL_CAPSULE | Freq: Three times a day (TID) | ORAL | 0 refills | Status: AC

## 2024-04-12 MED ORDER — IPRATROPIUM BROMIDE 0.06 % NA SOLN: 2.0000 | Freq: Four times a day (QID) | NASAL | 12 refills | Status: AC

## 2024-04-12 MED ORDER — PROMETHAZINE-DM 6.25-15 MG/5ML PO SYRP: 5.0000 mL | ORAL_SOLUTION | Freq: Four times a day (QID) | ORAL | 0 refills | Status: AC | PRN

## 2024-04-12 MED ORDER — PREDNISONE 20 MG PO TABS: 60.0000 mg | ORAL_TABLET | Freq: Every day | ORAL | 0 refills | Status: AC

## 2024-04-12 NOTE — ED Triage Notes (Signed)
 Patient c/o cough, bodyaches and nasal congestion and runny nose that started 2 days ago.  Patient unsure of fevers.

## 2024-04-12 NOTE — ED Provider Notes (Signed)
 " [facility]    CSN: [id number] Arrival date & time: 04/12/24  1044      History   Chief Complaint Chief Complaint  Patient presents with   Cough    Appointment    HPI SHERLIE BOYUM is a 68 y.o. female.   HPI  68 year old female with past medical history significant for hypertension, high cholesterol, GERD, COPD, melanoma, and seizures presents for evaluation of respiratory symptoms that started 2 days ago.  She is unaware of any sick contacts but she was around a lot of people at Christmas time.  She has not traveled out of state or country.  She reports that she felt she had a fever this morning but she did not measure fever.  Her nasal discharge has been clear but her cough has been productive for yellow-green mucus.  Also she is endorsing shortness breath and wheezing.  She has been using her rescue inhaler with minimal relief of symptoms.  Past Medical History:  Diagnosis Date   Arthritis    Asthma    COPD (chronic obstructive pulmonary disease) (HCC)    Dyspnea    GERD (gastroesophageal reflux disease)    Hypercholesterolemia    Hypertension    Melanoma (HCC)    approximately 8 years ago resected from Right lower leg.    Multiple thyroid  nodules    Seizure (HCC) 2013   x 1-unsure ot the cause   Wears dentures    partial upper and lower    Patient Active Problem List   Diagnosis Date Noted   Special screening for malignant neoplasms, colon 04/20/2022   Polyp of colon 04/20/2022   Epigastric pain 04/20/2022   Lumbar radiculopathy    GERD (gastroesophageal reflux disease) 09/21/2012   Stress 09/21/2012   Essential hypertension, benign 09/19/2012   Hypercholesterolemia 09/19/2012    Past Surgical History:  Procedure Laterality Date   BACK SURGERY     CATARACT EXTRACTION Bilateral    CHOLECYSTECTOMY     COLONOSCOPY     COLONOSCOPY WITH PROPOFOL  N/A 04/20/2022   Procedure: COLONOSCOPY WITH BIOPSY;  Surgeon: Jinny Carmine, MD;  Location: [facility];  Service: Endoscopy;  Laterality: N/A;   ESOPHAGOGASTRODUODENOSCOPY N/A 12/06/2023   Procedure: EGD (ESOPHAGOGASTRODUODENOSCOPY);  Surgeon: Unk Corinn Skiff, MD;  Location: [facility];  Service: Gastroenterology;  Laterality: N/A;   ESOPHAGOGASTRODUODENOSCOPY (EGD) WITH PROPOFOL  N/A 04/20/2022   Procedure: ESOPHAGOGASTRODUODENOSCOPY (EGD) WITH PROPOFOL ;  Surgeon: Jinny Carmine, MD;  Location: [facility];  Service: Endoscopy;  Laterality: N/A;   LUMBAR LAMINECTOMY/DECOMPRESSION MICRODISCECTOMY Right 12/26/2021   Procedure: RIGHT L4-5 LAMINOFORAMINOTOMY;  Surgeon: Clois Fret, MD;  Location: [facility];  Service: Neurosurgery;  Laterality: Right;   POLYPECTOMY N/A 04/20/2022   Procedure: POLYPECTOMY;  Surgeon: Jinny Carmine, MD;  Location: [facility];  Service: Endoscopy;  Laterality: N/A;   TONSILLECTOMY     TUBAL LIGATION      OB History   No obstetric history on file.      Home Medications    Prior to Admission medications  Medication Sig Start Date End Date Taking? Authorizing Provider  amoxicillin -clavulanate (AUGMENTIN ) 875-125 MG tablet Take 1 tablet by mouth every 12 (twelve) hours for 7 days. 04/12/24 04/19/24 Yes Bernardino Ditch, NP  benzonatate  (TESSALON ) 100 MG capsule Take 2 capsules (200 mg total) by mouth every 8 (eight) hours. 04/12/24  Yes Bernardino Ditch, NP  ipratropium (ATROVENT ) 0.06 % nasal spray Place 2 sprays into both nostrils 4 (four) times daily. 04/12/24  Yes Bernardino Ditch, NP  predniSONE  (DELTASONE ) 20 MG tablet Take 3 tablets (60 mg total) by mouth daily with breakfast for 5 days. 3 tablets daily for 5 days. 04/12/24 04/17/24 Yes Bernardino Ditch, NP  promethazine -dextromethorphan (PROMETHAZINE -DM) 6.25-15 MG/5ML syrup Take 5 mLs by mouth 4 (four) times daily as needed. 04/12/24  Yes Bernardino Ditch, NP  albuterol  (PROVENTIL  HFA;VENTOLIN  HFA) 108 (90 BASE) MCG/ACT inhaler Inhale 2 puffs into the lungs every 6 (six) hours as needed for  wheezing.    [provider]  atorvastatin  (LIPITOR) 10 MG tablet Take 1 tablet (10 mg total) by mouth daily. Patient taking differently: Take 10 mg by mouth every morning. 11/08/12   Glendia Shad, MD  Calcium  Carb-Cholecalciferol (CALCIUM  500 + D PO) Take 1 tablet by mouth daily at 6 (six) AM.    [provider]  celecoxib  (CELEBREX ) 100 MG capsule Take 1-2 tabs po bid prn pain 06/01/23   Eaves, Lesley B, PA-C  Cholecalciferol (D 1000) 25 MCG (1000 UT) capsule Take by mouth.    [provider]  citalopram  (CELEXA ) 40 MG tablet Take 1.5 tablets (60 mg total) by mouth daily. Patient taking differently: Take 40 mg by mouth every morning. 09/21/12   Glendia Shad, MD  cyanocobalamin (VITAMIN B12) 1000 MCG tablet Take by mouth.    [provider]  cyclobenzaprine  (FLEXERIL ) 10 MG tablet Take 1 tablet (10 mg total) by mouth 3 (three) times daily as needed for muscle spasms. Do not drive while taking as can cause drowsiness 02/11/21   Arvis Huxley B, PA-C  Fluticasone -Salmeterol (ADVAIR) 250-50 MCG/DOSE AEPB Inhale 1 puff into the lungs every morning. 05/24/18   [provider]  hyoscyamine (LEVSIN) 0.125 MG tablet Take 0.125 mg by mouth every 4 (four) hours as needed for cramping.    [provider]  ibuprofen  (ADVIL ) 600 MG tablet Take 1 tablet (600 mg total) by mouth every 8 (eight) hours as needed. 04/22/23   Mortenson, Ashley, MD  pantoprazole  (PROTONIX ) 40 MG tablet 40 mg 2 (two) times daily. 01/07/18   [provider]  ramipril  (ALTACE ) 5 MG capsule Take 5 mg by mouth every morning. 10/10/19   [provider]  sucralfate  (CARAFATE ) 1 g tablet Take 1 tablet (1 g total) by mouth 4 (four) times daily -  with meals and at bedtime for 14 days. 11/16/23 11/30/23  Ernest Ronal BRAVO, MD  sucralfate  (CARAFATE ) 1 g tablet Take 1 g by mouth 2 (two) times daily.    [provider]  SUMAtriptan  (IMITREX ) 25 MG tablet May repeat in 2 hours if  headache persists or recurs. Patient taking differently: Take 25 mg by mouth every 2 (two) hours as needed. May repeat in 2 hours if headache persists or recurs. 02/11/21   Arvis Huxley NOVAK, PA-C  traZODone  (DESYREL ) 150 MG tablet TAKE 1 TABLET (150 MG TOTAL) AT BEDTIME Patient taking differently: Take 200 mg by mouth at bedtime. 01/07/13   Glendia Shad, MD  umeclidinium bromide (INCRUSE ELLIPTA) 62.5 MCG/ACT AEPB Inhale 1 puff into the lungs every morning.    [provider]  esomeprazole  (NEXIUM ) 40 MG capsule Take 1 capsule (40 mg total) by mouth daily. 12/02/12 10/28/18  Glendia Shad, MD  fluticasone  (FLONASE ) 50 MCG/ACT nasal spray Place 2 sprays into both nostrils daily. 07/15/15 10/28/18  Desiderio Beagle, MD  hydrochlorothiazide  (HYDRODIURIL ) 25 MG tablet Take 1 tablet (25 mg total) by mouth daily. 09/30/12 04/12/20  Glendia Shad, MD    Family History  Family History  Problem Relation Age of Onset   Breast cancer Maternal Aunt 37   Dementia Mother    Heart disease Mother    Diabetes Mother    Heart attack Father    AAA (abdominal aortic aneurysm) Father    Hypertension Father    Hyperlipidemia Father     Social History Social History[1]   Allergies   Other   Review of Systems Review of Systems  Constitutional:  Negative for fever.  HENT:  Positive for congestion and rhinorrhea. Negative for ear pain.   Respiratory:  Positive for cough, shortness of breath and wheezing.   Musculoskeletal:  Positive for arthralgias and myalgias.     Physical Exam Triage Vital Signs ED Triage Vitals  Encounter Vitals Group     BP      Girls Systolic BP Percentile      Girls Diastolic BP Percentile      Boys Systolic BP Percentile      Boys Diastolic BP Percentile      Pulse      Resp      Temp      Temp src      SpO2      Weight      Height      Head Circumference      Peak Flow      Pain Score      Pain Loc      Pain Education      Exclude from Growth Chart     No data found.  Updated Vital Signs BP (!) 108/59 (BP Location: Right Arm)   Pulse 96   Temp 98.7 F (37.1 C) (Oral)   Resp 20   Ht 5' 4 (1.626 m)   Wt 161 lb 14.4 oz (73.4 kg)   SpO2 93%   BMI 27.79 kg/m   Visual Acuity Right Eye Distance:   Left Eye Distance:   Bilateral Distance:    Right Eye Near:   Left Eye Near:    Bilateral Near:     Physical Exam Vitals and nursing note reviewed.  Constitutional:      Appearance: Normal appearance. She is not ill-appearing.  HENT:     Head: Normocephalic and atraumatic.     Right Ear: Tympanic membrane, ear canal and external ear normal. There is no impacted cerumen.     Left Ear: Tympanic membrane, ear canal and external ear normal. There is no impacted cerumen.     Nose: Congestion and rhinorrhea present.     Comments: Nasal mucosa is mildly edematous with milky discharge in both nares.    Mouth/Throat:     Mouth: Mucous membranes are moist.     Pharynx: Oropharynx is clear. No oropharyngeal exudate or posterior oropharyngeal erythema.  Cardiovascular:     Rate and Rhythm: Normal rate and regular rhythm.     Pulses: Normal pulses.     Heart sounds: Normal heart sounds. No murmur heard.    No friction rub. No gallop.  Pulmonary:     Effort: Pulmonary effort is normal.     Comments: Decreased breath sounds diffusely. Musculoskeletal:     Cervical back: Normal range of motion and neck supple. No tenderness.  Lymphadenopathy:     Cervical: No cervical adenopathy.  Skin:    General: Skin is warm and dry.     Capillary Refill: Capillary refill takes less than 2 seconds.     Findings: No rash.  Neurological:  General: No focal deficit present.     Mental Status: She is alert and oriented to person, place, and time.      UC Treatments / Results  Labs (all labs ordered are listed, but only abnormal results are displayed) Labs Reviewed  POC SOFIA SARS ANTIGEN FIA - Normal  POCT INFLUENZA A/B - Normal     EKG   Radiology DG Chest 2 View Result Date: 04/12/2024 EXAM: 2 VIEW(S) XRAY OF THE CHEST 04/12/2024 11:15:00 AM COMPARISON: 06/01/2023 and CTA chest 11/16/2023. CLINICAL HISTORY: 68 year old female. Cough, shortness of breath, wheezing. History of chronic obstructive pulmonary disease. FINDINGS: LUNGS AND PLEURA: Chronic hyperinflation. Upper lobe centrilobular emphysema demonstrated on CTA. Coarse and symmetric bilateral lung markings appear chronic and stable. No focal pulmonary opacity. No pleural effusion. No pneumothorax. HEART AND MEDIASTINUM: No acute abnormality of the cardiac and mediastinal silhouettes. BONES AND SOFT TISSUES: No acute osseous abnormality. Chronic cholecystectomy clips. IMPRESSION: 1. Emphysema. No acute cardiopulmonary abnormality. Electronically signed by: Helayne Hurst MD 04/12/2024 12:20 PM EST RP Workstation: [id number]    Procedures Procedures (including critical care time)  Medications Ordered in UC Medications - No data to display  Initial Impression / Assessment and Plan / UC Course  I have reviewed the triage vital signs and the nursing notes.  Pertinent labs & imaging results that were available during my care of the patient were reviewed by me and considered in my medical decision making (see chart for details).   Patient is a pleasant, nontoxic-appearing 68 year old female presenting for evaluation 2 days worth of respiratory symptoms as outlined in HPI above.  She is also endorsing shortness of breath and wheezing, though she is able to speak in full sentences without dyspnea or tachypnea.  However, her oxygen saturation on room air is 93%.  Cardiopulmonary exam reveals 2 usually decreased lung sounds.  I will obtain a chest x-ray to evaluate for any acute cardiopulmonary pathology as well as order a COVID and influenza antigen test.  Chest x-ray independently reviewed and evaluated by me.  Impression: Patchy haziness in the right infrahilar region  as well as in the left lower lobe.  Cardiomediastinal silhouette appears normal.  Radiology read is pending. Radiology impression states emphysema without acute cardiopulmonary abnormality.  Influenza antigen test is negative.  COVID antigen test is negative.  I will discharge patient home with a diagnosis of COPD exacerbation.  I will cover her with Augmentin  875 mg twice daily for 7 days along with Tessalon  Perles and Promethazine  DM cough syrup for cough and congestion.  Atrovent  nasal spray for nasal congestion.  I will also order a 5-day burst dose of prednisone  60 mg.  Return precautions reviewed.   Final Clinical Impressions(s) / UC Diagnoses   Final diagnoses:  Acute cough  COPD exacerbation (HCC)     Discharge Instructions      Your COVID and flu testing were both negative and your chest x-ray does not show any evidence of pneumonia.  I do feel that you have an exacerbation of your COPD.  Because you are coughing up purulent sputum I do think some antibiotics are warranted.  Take the Augmentin  twice daily with food for 7 days for treatment of your COPD exacerbation.  Beginning tomorrow morning use the prednisone  60 mg to help decrease pulmonary inflammation and improve your breathing.  Continue to use your albuterol  inhaler as needed for any shortness of breath or wheezing.  Use the Atrovent  nasal spray, 2 squirts in each nostril every  6 hours, as needed for runny nose and postnasal drip.  Use the Tessalon  Perles every 8 hours during the day.  Take them with a small sip of water .  They may give you some numbness to the base of your tongue or a metallic taste in your mouth, this is normal.  Use the Promethazine  DM cough syrup at bedtime for cough and congestion.  It will make you drowsy so do not take it during the day.  Return for reevaluation or see your primary care provider for any new or worsening symptoms.      ED Prescriptions     Medication Sig Dispense  Auth. Provider   amoxicillin -clavulanate (AUGMENTIN ) 875-125 MG tablet Take 1 tablet by mouth every 12 (twelve) hours for 7 days. 14 tablet Bernardino Ditch, NP   benzonatate  (TESSALON ) 100 MG capsule Take 2 capsules (200 mg total) by mouth every 8 (eight) hours. 21 capsule Bernardino Ditch, NP   ipratropium (ATROVENT ) 0.06 % nasal spray Place 2 sprays into both nostrils 4 (four) times daily. 15 mL Bernardino Ditch, NP   promethazine -dextromethorphan (PROMETHAZINE -DM) 6.25-15 MG/5ML syrup Take 5 mLs by mouth 4 (four) times daily as needed. 118 mL Bernardino Ditch, NP   predniSONE  (DELTASONE ) 20 MG tablet Take 3 tablets (60 mg total) by mouth daily with breakfast for 5 days. 3 tablets daily for 5 days. 15 tablet Bernardino Ditch, NP      PDMP not reviewed this encounter.     [1]  Social History Tobacco Use   Smoking status: Former    Current packs/day: 0.00    Average packs/day: 2.0 packs/day    Types: Cigarettes    Quit date: 06/08/2005    Years since quitting: 18.8   Smokeless tobacco: Never  Vaping Use   Vaping status: Never Used  Substance Use Topics   Alcohol use: Yes    Comment: occasional   Drug use: No     Bernardino Ditch, NP 04/12/24 1225  "

## 2024-04-12 NOTE — Discharge Instructions (Addendum)
 Your COVID and flu testing were both negative and your chest x-ray does not show any evidence of pneumonia.  I do feel that you have an exacerbation of your COPD.  Because you are coughing up purulent sputum I do think some antibiotics are warranted.  Take the Augmentin  twice daily with food for 7 days for treatment of your COPD exacerbation.  Beginning tomorrow morning use the prednisone  60 mg to help decrease pulmonary inflammation and improve your breathing.  Continue to use your albuterol  inhaler as needed for any shortness of breath or wheezing.  Use the Atrovent  nasal spray, 2 squirts in each nostril every 6 hours, as needed for runny nose and postnasal drip.  Use the Tessalon  Perles every 8 hours during the day.  Take them with a small sip of water .  They may give you some numbness to the base of your tongue or a metallic taste in your mouth, this is normal.  Use the Promethazine  DM cough syrup at bedtime for cough and congestion.  It will make you drowsy so do not take it during the day.  Return for reevaluation or see your primary care provider for any new or worsening symptoms.

## 2024-04-15 ENCOUNTER — Other Ambulatory Visit: Payer: Self-pay | Admitting: Family Medicine

## 2024-04-15 DIAGNOSIS — Z1231 Encounter for screening mammogram for malignant neoplasm of breast: Secondary | ICD-10-CM

## 2024-05-12 ENCOUNTER — Ambulatory Visit

## 2024-06-03 ENCOUNTER — Ambulatory Visit
